# Patient Record
Sex: Female | Born: 1965 | Race: White | Hispanic: No | Marital: Married | Smoking: Former smoker
Health system: Southern US, Community
[De-identification: ages and names within clinical notes are randomized; demographics above are authoritative.]

## PROBLEM LIST (undated history)

## (undated) DIAGNOSIS — M8580 Other specified disorders of bone density and structure, unspecified site: Secondary | ICD-10-CM

## (undated) DIAGNOSIS — M7710 Lateral epicondylitis, unspecified elbow: Secondary | ICD-10-CM

## (undated) DIAGNOSIS — R51 Headache: Secondary | ICD-10-CM

## (undated) DIAGNOSIS — Z860100 Personal history of colon polyps, unspecified: Secondary | ICD-10-CM

## (undated) DIAGNOSIS — K449 Diaphragmatic hernia without obstruction or gangrene: Secondary | ICD-10-CM

## (undated) DIAGNOSIS — R519 Headache, unspecified: Secondary | ICD-10-CM

## (undated) DIAGNOSIS — M810 Age-related osteoporosis without current pathological fracture: Secondary | ICD-10-CM

## (undated) DIAGNOSIS — K317 Polyp of stomach and duodenum: Secondary | ICD-10-CM

## (undated) DIAGNOSIS — K529 Noninfective gastroenteritis and colitis, unspecified: Secondary | ICD-10-CM

## (undated) DIAGNOSIS — G43909 Migraine, unspecified, not intractable, without status migrainosus: Secondary | ICD-10-CM

## (undated) DIAGNOSIS — K219 Gastro-esophageal reflux disease without esophagitis: Secondary | ICD-10-CM

## (undated) DIAGNOSIS — G8929 Other chronic pain: Secondary | ICD-10-CM

## (undated) DIAGNOSIS — F419 Anxiety disorder, unspecified: Secondary | ICD-10-CM

## (undated) DIAGNOSIS — K589 Irritable bowel syndrome without diarrhea: Secondary | ICD-10-CM

## (undated) HISTORY — DX: Personal history of colon polyps, unspecified: Z86.0100

## (undated) HISTORY — PX: AUGMENTATION MAMMAPLASTY: SUR837

## (undated) HISTORY — DX: Personal history of colonic polyps: Z86.010

## (undated) HISTORY — DX: Headache: R51

## (undated) HISTORY — DX: Anxiety disorder, unspecified: F41.9

## (undated) HISTORY — DX: Noninfective gastroenteritis and colitis, unspecified: K52.9

## (undated) HISTORY — DX: Other chronic pain: G89.29

## (undated) HISTORY — DX: Lateral epicondylitis, unspecified elbow: M77.10

## (undated) HISTORY — DX: Diaphragmatic hernia without obstruction or gangrene: K44.9

## (undated) HISTORY — PX: COLONOSCOPY: SHX174

## (undated) HISTORY — DX: Gastro-esophageal reflux disease without esophagitis: K21.9

## (undated) HISTORY — DX: Irritable bowel syndrome, unspecified: K58.9

## (undated) HISTORY — DX: Headache, unspecified: R51.9

## (undated) HISTORY — DX: Other specified disorders of bone density and structure, unspecified site: M85.80

## (undated) HISTORY — PX: POLYPECTOMY: SHX149

## (undated) HISTORY — DX: Age-related osteoporosis without current pathological fracture: M81.0

## (undated) HISTORY — PX: BREAST ENHANCEMENT SURGERY: SHX7

## (undated) HISTORY — DX: Polyp of stomach and duodenum: K31.7

---

## 1998-08-27 ENCOUNTER — Other Ambulatory Visit: Admission: RE | Admit: 1998-08-27 | Discharge: 1998-08-27 | Payer: Self-pay | Admitting: Obstetrics & Gynecology

## 1998-08-30 ENCOUNTER — Other Ambulatory Visit: Admission: RE | Admit: 1998-08-30 | Discharge: 1998-08-30 | Payer: Self-pay | Admitting: Internal Medicine

## 1999-08-28 ENCOUNTER — Other Ambulatory Visit: Admission: RE | Admit: 1999-08-28 | Discharge: 1999-08-28 | Payer: Self-pay | Admitting: Obstetrics & Gynecology

## 2000-09-15 ENCOUNTER — Other Ambulatory Visit: Admission: RE | Admit: 2000-09-15 | Discharge: 2000-09-15 | Payer: Self-pay | Admitting: Obstetrics & Gynecology

## 2001-10-04 ENCOUNTER — Other Ambulatory Visit: Admission: RE | Admit: 2001-10-04 | Discharge: 2001-10-04 | Payer: Self-pay | Admitting: Obstetrics & Gynecology

## 2002-10-17 ENCOUNTER — Other Ambulatory Visit: Admission: RE | Admit: 2002-10-17 | Discharge: 2002-10-17 | Payer: Self-pay | Admitting: Obstetrics & Gynecology

## 2003-08-10 ENCOUNTER — Encounter: Admission: RE | Admit: 2003-08-10 | Discharge: 2003-11-08 | Payer: Self-pay | Admitting: Internal Medicine

## 2003-11-07 ENCOUNTER — Other Ambulatory Visit: Admission: RE | Admit: 2003-11-07 | Discharge: 2003-11-07 | Payer: Self-pay | Admitting: Obstetrics & Gynecology

## 2003-12-04 ENCOUNTER — Emergency Department (HOSPITAL_COMMUNITY): Admission: EM | Admit: 2003-12-04 | Discharge: 2003-12-04 | Payer: Self-pay

## 2004-12-02 ENCOUNTER — Other Ambulatory Visit: Admission: RE | Admit: 2004-12-02 | Discharge: 2004-12-02 | Payer: Self-pay | Admitting: Obstetrics & Gynecology

## 2005-08-28 ENCOUNTER — Encounter: Admission: RE | Admit: 2005-08-28 | Discharge: 2005-08-28 | Payer: Self-pay | Admitting: Internal Medicine

## 2005-09-05 ENCOUNTER — Encounter: Admission: RE | Admit: 2005-09-05 | Discharge: 2005-09-05 | Payer: Self-pay | Admitting: Plastic Surgery

## 2007-03-02 ENCOUNTER — Encounter: Admission: RE | Admit: 2007-03-02 | Discharge: 2007-03-02 | Payer: Self-pay | Admitting: Internal Medicine

## 2007-03-17 ENCOUNTER — Encounter: Admission: RE | Admit: 2007-03-17 | Discharge: 2007-03-17 | Payer: Self-pay | Admitting: Internal Medicine

## 2007-03-17 ENCOUNTER — Encounter (INDEPENDENT_AMBULATORY_CARE_PROVIDER_SITE_OTHER): Payer: Self-pay | Admitting: Diagnostic Radiology

## 2007-03-17 ENCOUNTER — Other Ambulatory Visit: Admission: RE | Admit: 2007-03-17 | Discharge: 2007-03-17 | Payer: Self-pay | Admitting: Diagnostic Radiology

## 2007-05-04 ENCOUNTER — Ambulatory Visit: Payer: Self-pay | Admitting: Internal Medicine

## 2007-05-12 ENCOUNTER — Ambulatory Visit: Payer: Self-pay | Admitting: Internal Medicine

## 2007-05-13 ENCOUNTER — Ambulatory Visit: Payer: Self-pay | Admitting: Internal Medicine

## 2007-05-13 ENCOUNTER — Encounter: Admission: RE | Admit: 2007-05-13 | Discharge: 2007-05-13 | Payer: Self-pay | Admitting: Obstetrics & Gynecology

## 2007-05-14 ENCOUNTER — Ambulatory Visit: Payer: Self-pay | Admitting: Internal Medicine

## 2007-05-17 ENCOUNTER — Ambulatory Visit: Payer: Self-pay | Admitting: Internal Medicine

## 2007-05-24 ENCOUNTER — Ambulatory Visit: Payer: Self-pay | Admitting: Internal Medicine

## 2007-05-31 ENCOUNTER — Ambulatory Visit: Payer: Self-pay | Admitting: Internal Medicine

## 2007-06-07 ENCOUNTER — Ambulatory Visit: Payer: Self-pay | Admitting: Internal Medicine

## 2007-06-17 ENCOUNTER — Ambulatory Visit: Payer: Self-pay | Admitting: Internal Medicine

## 2007-06-17 ENCOUNTER — Encounter: Payer: Self-pay | Admitting: Internal Medicine

## 2007-06-17 ENCOUNTER — Encounter: Payer: Self-pay | Admitting: Gastroenterology

## 2007-06-17 DIAGNOSIS — K449 Diaphragmatic hernia without obstruction or gangrene: Secondary | ICD-10-CM

## 2007-06-17 DIAGNOSIS — K219 Gastro-esophageal reflux disease without esophagitis: Secondary | ICD-10-CM

## 2007-06-17 HISTORY — DX: Diaphragmatic hernia without obstruction or gangrene: K44.9

## 2007-06-17 HISTORY — DX: Gastro-esophageal reflux disease without esophagitis: K21.9

## 2007-07-07 ENCOUNTER — Ambulatory Visit: Payer: Self-pay | Admitting: Internal Medicine

## 2007-08-09 ENCOUNTER — Ambulatory Visit: Payer: Self-pay | Admitting: Internal Medicine

## 2007-08-20 DIAGNOSIS — M899 Disorder of bone, unspecified: Secondary | ICD-10-CM | POA: Insufficient documentation

## 2007-08-20 DIAGNOSIS — F411 Generalized anxiety disorder: Secondary | ICD-10-CM | POA: Insufficient documentation

## 2007-08-20 DIAGNOSIS — K5289 Other specified noninfective gastroenteritis and colitis: Secondary | ICD-10-CM | POA: Insufficient documentation

## 2007-08-20 DIAGNOSIS — T7840XA Allergy, unspecified, initial encounter: Secondary | ICD-10-CM | POA: Insufficient documentation

## 2007-08-20 DIAGNOSIS — K589 Irritable bowel syndrome without diarrhea: Secondary | ICD-10-CM

## 2007-08-20 DIAGNOSIS — M949 Disorder of cartilage, unspecified: Secondary | ICD-10-CM

## 2007-08-20 DIAGNOSIS — K921 Melena: Secondary | ICD-10-CM | POA: Insufficient documentation

## 2007-09-09 ENCOUNTER — Ambulatory Visit: Payer: Self-pay | Admitting: Internal Medicine

## 2007-10-07 ENCOUNTER — Ambulatory Visit: Payer: Self-pay | Admitting: Internal Medicine

## 2007-11-05 ENCOUNTER — Ambulatory Visit: Payer: Self-pay | Admitting: Internal Medicine

## 2007-11-11 ENCOUNTER — Ambulatory Visit: Payer: Self-pay | Admitting: Internal Medicine

## 2007-11-11 LAB — CONVERTED CEMR LAB: Vitamin B-12: 446 pg/mL (ref 211–911)

## 2008-05-19 ENCOUNTER — Ambulatory Visit: Payer: Self-pay | Admitting: Internal Medicine

## 2008-05-22 LAB — CONVERTED CEMR LAB: Vitamin B-12: 186 pg/mL — ABNORMAL LOW (ref 211–911)

## 2008-05-25 ENCOUNTER — Encounter: Admission: RE | Admit: 2008-05-25 | Discharge: 2008-05-25 | Payer: Self-pay | Admitting: Obstetrics & Gynecology

## 2008-05-29 ENCOUNTER — Ambulatory Visit: Payer: Self-pay | Admitting: Internal Medicine

## 2008-05-29 DIAGNOSIS — D518 Other vitamin B12 deficiency anemias: Secondary | ICD-10-CM

## 2008-05-30 ENCOUNTER — Ambulatory Visit: Payer: Self-pay | Admitting: Internal Medicine

## 2008-05-31 ENCOUNTER — Ambulatory Visit: Payer: Self-pay | Admitting: Internal Medicine

## 2008-06-05 ENCOUNTER — Ambulatory Visit: Payer: Self-pay | Admitting: Internal Medicine

## 2008-06-12 ENCOUNTER — Ambulatory Visit: Payer: Self-pay | Admitting: Internal Medicine

## 2008-06-19 ENCOUNTER — Ambulatory Visit: Payer: Self-pay | Admitting: Internal Medicine

## 2008-07-19 ENCOUNTER — Ambulatory Visit: Payer: Self-pay | Admitting: Internal Medicine

## 2008-08-21 ENCOUNTER — Ambulatory Visit: Payer: Self-pay | Admitting: Internal Medicine

## 2008-09-21 ENCOUNTER — Ambulatory Visit: Payer: Self-pay | Admitting: Gastroenterology

## 2008-09-26 ENCOUNTER — Encounter: Admission: RE | Admit: 2008-09-26 | Discharge: 2008-09-26 | Payer: Self-pay | Admitting: Obstetrics & Gynecology

## 2008-10-19 ENCOUNTER — Ambulatory Visit: Payer: Self-pay | Admitting: Gastroenterology

## 2008-11-13 ENCOUNTER — Ambulatory Visit: Payer: Self-pay | Admitting: Internal Medicine

## 2008-11-14 LAB — CONVERTED CEMR LAB: Vitamin B-12: 397 pg/mL (ref 211–911)

## 2008-11-20 ENCOUNTER — Ambulatory Visit: Payer: Self-pay | Admitting: Internal Medicine

## 2008-12-21 ENCOUNTER — Ambulatory Visit: Payer: Self-pay | Admitting: Internal Medicine

## 2009-01-23 ENCOUNTER — Ambulatory Visit: Payer: Self-pay | Admitting: Internal Medicine

## 2009-02-22 ENCOUNTER — Ambulatory Visit: Payer: Self-pay | Admitting: Internal Medicine

## 2009-03-23 ENCOUNTER — Ambulatory Visit: Payer: Self-pay | Admitting: Internal Medicine

## 2009-04-23 ENCOUNTER — Ambulatory Visit: Payer: Self-pay | Admitting: Internal Medicine

## 2009-05-14 ENCOUNTER — Ambulatory Visit: Payer: Self-pay | Admitting: Internal Medicine

## 2009-05-15 LAB — CONVERTED CEMR LAB: Vitamin B-12: 285 pg/mL (ref 211–911)

## 2009-05-21 ENCOUNTER — Ambulatory Visit: Payer: Self-pay | Admitting: Internal Medicine

## 2009-05-28 ENCOUNTER — Encounter: Admission: RE | Admit: 2009-05-28 | Discharge: 2009-05-28 | Payer: Self-pay | Admitting: Obstetrics & Gynecology

## 2009-06-20 ENCOUNTER — Ambulatory Visit: Payer: Self-pay | Admitting: Internal Medicine

## 2010-05-07 ENCOUNTER — Ambulatory Visit: Payer: Self-pay | Admitting: Internal Medicine

## 2010-05-09 LAB — CONVERTED CEMR LAB: Vitamin B-12: 510 pg/mL (ref 211–911)

## 2010-05-29 ENCOUNTER — Encounter: Admission: RE | Admit: 2010-05-29 | Discharge: 2010-05-29 | Payer: Self-pay | Admitting: Obstetrics & Gynecology

## 2010-09-01 LAB — CONVERTED CEMR LAB
Basophils Absolute: 0 10*3/uL (ref 0.0–0.1)
Basophils Relative: 0.3 % (ref 0.0–1.0)
Eosinophils Absolute: 0 10*3/uL (ref 0.0–0.6)
Eosinophils Relative: 1.1 % (ref 0.0–5.0)
HCT: 39.4 % (ref 36.0–46.0)
Hemoglobin: 13.6 g/dL (ref 12.0–15.0)
Iron: 104 ug/dL (ref 42–145)
Lymphocytes Relative: 31.7 % (ref 12.0–46.0)
MCHC: 34.6 g/dL (ref 30.0–36.0)
MCV: 88.9 fL (ref 78.0–100.0)
Monocytes Absolute: 0.4 10*3/uL (ref 0.2–0.7)
Monocytes Relative: 11.4 % — ABNORMAL HIGH (ref 3.0–11.0)
Neutro Abs: 2.1 10*3/uL (ref 1.4–7.7)
Neutrophils Relative %: 55.5 % (ref 43.0–77.0)
Platelets: 329 10*3/uL (ref 150–400)
RBC: 4.43 M/uL (ref 3.87–5.11)
RDW: 11.5 % (ref 11.5–14.6)
Saturation Ratios: 23.3 % (ref 20.0–50.0)
Sed Rate: 14 mm/hr (ref 0–25)
Tissue Transglutaminase Ab, IgA: 0.5 units (ref <7)
Transferrin: 319.5 mg/dL (ref >=212.0)
Vitamin B-12: 120 pg/mL — ABNORMAL LOW (ref 211–911)
WBC: 3.7 10*3/uL — ABNORMAL LOW (ref 4.5–10.5)

## 2010-10-03 ENCOUNTER — Encounter (INDEPENDENT_AMBULATORY_CARE_PROVIDER_SITE_OTHER): Payer: Self-pay | Admitting: *Deleted

## 2010-10-03 ENCOUNTER — Other Ambulatory Visit: Payer: Self-pay | Admitting: Internal Medicine

## 2010-10-03 ENCOUNTER — Other Ambulatory Visit: Payer: Self-pay

## 2010-10-03 DIAGNOSIS — D509 Iron deficiency anemia, unspecified: Secondary | ICD-10-CM

## 2010-10-03 LAB — CBC WITH DIFFERENTIAL/PLATELET
Basophils Absolute: 0 10*3/uL (ref 0.0–0.1)
Eosinophils Absolute: 0.1 10*3/uL (ref 0.0–0.7)
HCT: 38.1 % (ref 36.0–46.0)
Lymphs Abs: 1.9 10*3/uL (ref 0.7–4.0)
MCV: 90.1 fl (ref 78.0–100.0)
Monocytes Absolute: 0.5 10*3/uL (ref 0.1–1.0)
Neutro Abs: 2.4 10*3/uL (ref 1.4–7.7)
Platelets: 320 10*3/uL (ref 150.0–400.0)
RDW: 12.2 % (ref 11.5–14.6)
WBC: 4.9 10*3/uL (ref 4.5–10.5)

## 2010-10-29 ENCOUNTER — Other Ambulatory Visit: Payer: Self-pay | Admitting: Internal Medicine

## 2010-12-17 NOTE — Assessment & Plan Note (Signed)
[facility]                         GASTROENTEROLOGY OFFICE NOTE   NAME:Sue Blair                         MRN:          [id number]  DATE:05/04/2007                            DOB:          May 16, 1966    GASTROENTEROLOGY CONSULTATION   REASON FOR CONSULTATION:  Sue Blair is a very nice 45 year old white  female referred by Dr. Timothy Lasso for evaluation of Hemoccult positive stool.  The patient denies any visible blood per rectum.  The Hemoccult's were  positive on a home test.  We have seen Sue Blair in the past in 1995  initially for chronic diarrhea and subsequently in 2004 for evaluation  of possible Sprue.  Her mother has collagen vascular disease and  Sjogren's syndrome.  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  The diarrhea subsided spontaneously.  She was initially put on Questran with good results.  She denies  currently having any diarrhea.  As far as the Sprue evaluation is  concerned, she had a positive antigliaden antibody, IgG in 2000.  Her  reticulin IgA was negative and endomysial antibody IgA was negative.  Her serum keratin was low at 5.  We have done a small bowel biopsy on  her based on the blood abnormalities but she had no evidence of villous  atrophy.  Her duodenal biopsies were normal in January of 2000.  She has  a family history of colon cancer in both maternal and paternal uncles  and a history of Crohn's disease in paternal uncle.  She also has  osteopenia.   MEDICATIONS:  1. Garnette Scheuermann.  2. Birth control pills.  3. Paxil 20 mg p.o. daily.  4. Prilosec 20 mg p.o. daily.  5. Calcium supplements with vitamin D.   PAST MEDICAL HISTORY:  Significant for allergies, thyroid problems,  anxiety.   FAMILY HISTORY:  As mentioned above in history of present illness.   SOCIAL HISTORY:  Married with two children.  She works as an Engineer, manufacturing.  She doesn't smoke and doesn't drink  alcohol.   REVIEW OF SYSTEMS:  Is essentially negative.   PHYSICAL EXAMINATION:  VITAL SIGNS:  Blood pressure 106/64, pulse 78,  weight 155 Blair.  She was 121 Blair about 6 years ago.  GENERAL APPEARANCE:  She appears healthy and in no distress.  HEENT:  Sclerae is nonicteric.  Oral cavity did not show any aphthous  ulcers.  There was no cheilosis.  Tongue was papillated.  LUNGS:  Clear to auscultation.  NECK:  Thyroid was normal.  CARDIOVASCULAR:  Normal S1 and normal S2.  ABDOMEN:  Soft, somewhat doughy with normal active bowel sounds.  Normal  distention.  No tenderness.  Liver edge at coastal margin.  There was a  small ventral hernia from rectus diathesis in upper abdomen.  RECTAL:  Rectal exam today shows normal perirenal area with very scant  yellow Hemoccult negative stool.  EXTREMITIES:  No edema.  SKIN:  Did not show any palmar erythema or stigmata of chronic liver  disease.   IMPRESSION:  1. This is  a 45 year old white female with history of nonspecific      lymphocytic colitis, currently asymptomatic.  2. Positive serological markers for Sprue with normal small bowel      biopsy in 2000.  This raises a question of autoimmune disease      versus variant of celiac Sprue.  The patient has been on very      liberal gluten-free diet, eating bread on occasions.  3. Osteopenia.  4. Hemoccult positive stool, not reproduced on today's exam.  5. Positive family history of colon cancer in two indirect relatives      from both mother's and father's side.   PLAN:  1. The patient has a very interesting problem of having an autoimmune      markers without actually specific diagnosis.  I think it would be      prudent to determine whether she truly has a celiac Sprue because      this would have a great impact on her diet.  For this reason, I      think she needs to have a repeat upper endoscopy with small bowel      biopsies as well as colonoscopy to rule out ongoing low  grade      inflammatory bowel disease.  We will do the small bowel biopsy      after she has been off gluten-free diet completely for four to six      weeks, which would maximize our yield on the small biopsy which      ought to demonstrate at least some degree of villous aytrophy.  2. Today we will obtain inflammatory bowel disease markers, Prometheus      profile.  Also check on her tissue transglutaminase levels, sed      rate, IgA levels, B12, iron studies and CBC.  In the past, she had      normal iron levels.  3. The patient was, today, instructed to go off the gluten-free diet      and we have gone ahead and scheduled her upper endoscopy and      colonoscopy as well.     Hedwig Morton. Juanda Chance, MD  Electronically Signed    DMB/MedQ  DD: 05/04/2007  DT: 05/04/2007  Job #: [id number]   cc:   Sue Pounds, MD

## 2011-01-29 ENCOUNTER — Other Ambulatory Visit: Payer: Self-pay | Admitting: Internal Medicine

## 2011-04-27 ENCOUNTER — Other Ambulatory Visit: Payer: Self-pay | Admitting: Internal Medicine

## 2011-06-12 ENCOUNTER — Other Ambulatory Visit: Payer: Self-pay | Admitting: Obstetrics & Gynecology

## 2011-06-12 ENCOUNTER — Other Ambulatory Visit: Payer: Self-pay | Admitting: Internal Medicine

## 2011-06-12 DIAGNOSIS — Z1231 Encounter for screening mammogram for malignant neoplasm of breast: Secondary | ICD-10-CM

## 2011-07-10 ENCOUNTER — Ambulatory Visit: Payer: BC Managed Care – PPO

## 2011-08-04 ENCOUNTER — Ambulatory Visit
Admission: RE | Admit: 2011-08-04 | Discharge: 2011-08-04 | Disposition: A | Discharge status: Home / Self Care | Payer: BC Managed Care – PPO | Source: Ambulatory Visit | Attending: Obstetrics & Gynecology | Admitting: Obstetrics & Gynecology

## 2011-08-04 DIAGNOSIS — Z1231 Encounter for screening mammogram for malignant neoplasm of breast: Secondary | ICD-10-CM

## 2011-08-08 ENCOUNTER — Other Ambulatory Visit: Payer: Self-pay | Admitting: Obstetrics & Gynecology

## 2011-08-08 DIAGNOSIS — N63 Unspecified lump in unspecified breast: Secondary | ICD-10-CM

## 2011-08-15 ENCOUNTER — Ambulatory Visit
Admission: RE | Admit: 2011-08-15 | Discharge: 2011-08-15 | Disposition: A | Discharge status: Home / Self Care | Payer: BC Managed Care – PPO | Source: Ambulatory Visit | Attending: Obstetrics & Gynecology | Admitting: Obstetrics & Gynecology

## 2011-08-15 ENCOUNTER — Other Ambulatory Visit: Payer: Self-pay | Admitting: Obstetrics & Gynecology

## 2011-08-15 DIAGNOSIS — N63 Unspecified lump in unspecified breast: Secondary | ICD-10-CM

## 2011-12-31 ENCOUNTER — Telehealth: Payer: Self-pay | Admitting: Internal Medicine

## 2011-12-31 NOTE — Telephone Encounter (Signed)
Spoke with patient and she states one week ago, she began having pressure in the rectal area. States she feels like she needs to go to the bathroom but does not. Denies diarrhea or bleeding. States she does have some constipation at times. Today she also has lower back pain. She is concerned because she has an uncle that had colon cancer. Normal colonoscopy in 06/2007. Scheduled OV with Willette Cluster, NP on 01/01/12 at 3:30 PM.

## 2012-01-01 ENCOUNTER — Encounter: Payer: Self-pay | Admitting: *Deleted

## 2012-01-01 ENCOUNTER — Other Ambulatory Visit: Payer: Self-pay | Admitting: Internal Medicine

## 2012-01-01 ENCOUNTER — Ambulatory Visit (INDEPENDENT_AMBULATORY_CARE_PROVIDER_SITE_OTHER): Payer: BC Managed Care – PPO | Admitting: Nurse Practitioner

## 2012-01-01 VITALS — BP 110/84 | HR 88 | Ht 66.0 in | Wt 152.0 lb

## 2012-01-01 DIAGNOSIS — K648 Other hemorrhoids: Secondary | ICD-10-CM

## 2012-01-01 DIAGNOSIS — K5900 Constipation, unspecified: Secondary | ICD-10-CM

## 2012-01-01 MED ORDER — HYDROCORTISONE ACETATE 25 MG RE SUPP: 25.0000 mg | Freq: Two times a day (BID) | RECTAL | Status: AC

## 2012-01-01 NOTE — Progress Notes (Signed)
01/01/2012 Sue Blair [id number] 1966/03/29   HISTORY OF PRESENT ILLNESS: Patient is a 46 year old female known to Dr. Juanda Chance. She had an EGD / colon in November 2008. Patient comes in today for evaluation of rectal pain. No rectal bleedings. Pain up in left side of rectum. Defecation helps for a few minutes then pressure returns. Patient was constipated last week after taking a few Bentyl. She is doing better after starting stool softeners    Past Medical History  Diagnosis Date  . Hiatal hernia 06/17/2007  . Reflux esophagitis 06/17/2007  . Benign neoplasm of stomach   . Anxiety   . Chronic headaches   . History of colon polyps   . IBS (irritable bowel syndrome)    Past Surgical History  Procedure Date  . Cesarean section 1998  . Breast enhancement surgery     reports that she has quit smoking. She has never used smokeless tobacco. She reports that she does not drink alcohol or use illicit drugs. family history includes Breast cancer in her maternal aunt, maternal grandmother, and mother; Colon cancer in her maternal uncle and paternal uncle; Crohn's disease in her paternal uncle; and Diabetes in her father. Allergies  Allergen Reactions  . Clarithromycin   . Sulfonamide Derivatives       Outpatient Encounter Prescriptions as of 01/01/2012  Medication Sig Dispense Refill  . ALPRAZolam (XANAX) 0.5 MG tablet Take 0.5 mg by mouth at bedtime as needed.      Marland Kitchen aspirin 81 MG tablet Take 81 mg by mouth daily.      . Calcium Citrate-Vitamin D (CITRUS CALCIUM + D PO) Take 1 tablet by mouth daily.      Marland Kitchen desogestrel-ethinyl estradiol (VIORELE) 0.15-0.02/0.01 MG (21/5) tablet Take 1 tablet by mouth daily.      Marland Kitchen dicyclomine (BENTYL) 20 MG tablet Take 20 mg by mouth 3 (three) times daily as needed.      . ondansetron (ZOFRAN) 4 MG tablet Take 4 mg by mouth as needed.      Marland Kitchen oxyCODONE-acetaminophen (PERCOCET) 5-325 MG per tablet Take 1 tablet by mouth as needed.      Marland Kitchen PRILOSEC OTC 20  MG tablet TAKE 1 TABLET BY MOUTH TWO TIMES A DAY  84 tablet  0  . venlafaxine XR (EFFEXOR-XR) 150 MG 24 hr capsule Take 150 mg by mouth daily.         REVIEW OF SYSTEMS  : Positive for sinus trouble, anxiety, headaches, and night sweats. All other systems reviewed and negative except where noted in the History of Present Illness.   PHYSICAL EXAM: BP 110/84  Pulse 88  Ht 5\' 6"  (1.676 m)  Wt 152 lb (68.947 kg)  BMI 24.53 kg/m2  LMP 04/14/2011 General: Well developed white female in no acute distress Head: Normocephalic and atraumatic Eyes:  sclerae anicteric,conjunctive pink. Ears: Normal auditory acuity Neck: Supple, no masses.  Lungs: Clear throughout to auscultation Heart: Regular rate and rhythm; no murmurs heard Abdomen: Soft, non distended, nontender. No masses or hepatomegaly noted. Normal Bowel sounds Rectal: no external lesions. On anoscopy there are swollen internal hemorrhoids. Musculoskeletal: Symmetrical with no gross deformities  Skin: No lesions on visible extremities Extremities: No edema or deformities noted Neurological: Alert oriented, grossly nonfocal Psychological:  Alert and cooperative. Normal mood and affect  ASSESSMENT AND PLAN; 76. 46 year old female with rectal pressure. She has inflamed internal hemorrhoids of exam. Suspect discomfort (pressure) relates to her internal hemorrhoids in some way. Will  treat with a course of steroid suppositories. She will good need bowel regimen to prevent future hemorrhoidal flares. See $2.  2. Constipation. Start Fiber. Begain daily Miralax.

## 2012-01-01 NOTE — Telephone Encounter (Signed)
She has a hx of lymphocytic colitis 1995, I agree with an appointment

## 2012-01-01 NOTE — Patient Instructions (Signed)
We sent prescription for Anusol [facility] Suppository twice daily for 10 days to [facility] [address]. We have given you coupons for Metamucil.  Continue Stool softners and if stools are not soft daily you can take Miralax as needed.  Call us in 10 days for an update.

## 2012-01-04 ENCOUNTER — Encounter: Payer: Self-pay | Admitting: Nurse Practitioner

## 2012-01-04 DIAGNOSIS — K5900 Constipation, unspecified: Secondary | ICD-10-CM | POA: Insufficient documentation

## 2012-01-04 DIAGNOSIS — K648 Other hemorrhoids: Secondary | ICD-10-CM | POA: Insufficient documentation

## 2012-01-06 NOTE — Progress Notes (Signed)
Agree with Ms. Guenther's assessment and plan. Suhayla Chisom E. Kafi Dotter, MD, FACG   

## 2012-06-21 ENCOUNTER — Other Ambulatory Visit: Payer: Self-pay | Admitting: Internal Medicine

## 2012-06-21 DIAGNOSIS — E538 Deficiency of other specified B group vitamins: Secondary | ICD-10-CM

## 2012-06-21 NOTE — Telephone Encounter (Signed)
Patient saw Dr. Jennette Kettle a couple of months ago and her B 12 was low. She is going to fax the results for Dr. Juanda Chance to review. She states she took B12 injections in the past and thinks she may need to restart them. She is interested in trying the nasal spray if possible. Will have MD review labs. Please, advise.

## 2012-06-22 NOTE — Telephone Encounter (Signed)
Please obtain B12 level or fax from Dr Jennette Kettle. Nasal spray may be more expensive. Marland Kitchen

## 2012-06-22 NOTE — Telephone Encounter (Signed)
Lab in MD basket for review.

## 2012-06-23 MED ORDER — DICYCLOMINE HCL 20 MG PO TABS: ORAL_TABLET | ORAL | Status: DC

## 2012-06-23 MED ORDER — CYANOCOBALAMIN 1000 MCG/ML IJ SOLN: INTRAMUSCULAR | Status: DC

## 2012-06-23 NOTE — Telephone Encounter (Signed)
B12=171pg, too low. I don't recommend nasal B12 since it is unreliable. She should start B12 1000ug IM q week x 4 then she can switch to nasal B12, recheck B12 level in 6 months

## 2012-06-23 NOTE — Telephone Encounter (Signed)
Spoke with patient and she had decided to do injection of B12. Rx sent to her pharmacy for this. She will have monthly injections after the 4 weekly injections as per Dr. Juanda Chance. Labs in [facility] for 6 month follow up.  Patient also asking for refill of Bentyl.Patient aware.

## 2012-06-23 NOTE — Telephone Encounter (Signed)
Dr. Donnetta Hail lab results are in MD basket for review.

## 2012-09-29 ENCOUNTER — Other Ambulatory Visit: Payer: Self-pay

## 2012-09-29 DIAGNOSIS — Z1231 Encounter for screening mammogram for malignant neoplasm of breast: Secondary | ICD-10-CM

## 2012-09-29 DIAGNOSIS — Z9882 Breast implant status: Secondary | ICD-10-CM

## 2012-09-29 DIAGNOSIS — Z803 Family history of malignant neoplasm of breast: Secondary | ICD-10-CM

## 2012-10-13 ENCOUNTER — Other Ambulatory Visit: Payer: Self-pay | Admitting: Internal Medicine

## 2012-10-14 NOTE — Telephone Encounter (Signed)
Patient advised that there is a Acupuncturist on b12 injectable solution. I have given the patient an option of Nascobal or doing oral b12 daily x 1 month. She would rather take an oral b12 1000 mcg daily x 1 month. She will then come back for her scheduled b12 level to be drawn. Advised that once we get these levels back, we will plan further disposition. Patient verbalizes understanding.

## 2012-10-18 ENCOUNTER — Ambulatory Visit
Admission: RE | Admit: 2012-10-18 | Discharge: 2012-10-18 | Disposition: A | Discharge status: Home / Self Care | Payer: BC Managed Care – PPO | Source: Ambulatory Visit

## 2012-10-18 DIAGNOSIS — Z803 Family history of malignant neoplasm of breast: Secondary | ICD-10-CM

## 2012-10-18 DIAGNOSIS — Z9882 Breast implant status: Secondary | ICD-10-CM

## 2012-10-18 DIAGNOSIS — Z1231 Encounter for screening mammogram for malignant neoplasm of breast: Secondary | ICD-10-CM

## 2012-12-16 ENCOUNTER — Telehealth: Payer: Self-pay | Admitting: *Deleted

## 2012-12-16 DIAGNOSIS — E538 Deficiency of other specified B group vitamins: Secondary | ICD-10-CM

## 2012-12-16 NOTE — Telephone Encounter (Signed)
Spoke with patient and she will come for lab work. 

## 2012-12-16 NOTE — Telephone Encounter (Signed)
Message copied by Daphine Deutscher on Thu Dec 16, 2012  2:17 PM ------      Message from: Daphine Deutscher      Created: Wed Jun 23, 2012  2:33 PM       Call and remind patient B 12 level due for db on 12/20/12 ------

## 2012-12-22 ENCOUNTER — Other Ambulatory Visit (INDEPENDENT_AMBULATORY_CARE_PROVIDER_SITE_OTHER): Payer: BC Managed Care – PPO

## 2012-12-22 DIAGNOSIS — E538 Deficiency of other specified B group vitamins: Secondary | ICD-10-CM

## 2012-12-23 ENCOUNTER — Other Ambulatory Visit: Payer: Self-pay | Admitting: *Deleted

## 2012-12-23 DIAGNOSIS — E538 Deficiency of other specified B group vitamins: Secondary | ICD-10-CM

## 2013-06-23 ENCOUNTER — Other Ambulatory Visit (INDEPENDENT_AMBULATORY_CARE_PROVIDER_SITE_OTHER): Payer: BC Managed Care – PPO

## 2013-06-23 ENCOUNTER — Telehealth: Payer: Self-pay | Admitting: *Deleted

## 2013-06-23 DIAGNOSIS — E538 Deficiency of other specified B group vitamins: Secondary | ICD-10-CM

## 2013-06-23 NOTE — Telephone Encounter (Signed)
Message copied by Daphine Deutscher on Thu Jun 23, 2013  8:21 AM ------      Message from: Daphine Deutscher      Created: Thu Dec 23, 2012  1:31 PM       Call and remind due for B12 level DB on 06/27/13 ------

## 2013-06-23 NOTE — Telephone Encounter (Signed)
Spoke with patient and she will come for lab. 

## 2013-06-24 ENCOUNTER — Other Ambulatory Visit: Payer: Self-pay | Admitting: *Deleted

## 2013-06-24 DIAGNOSIS — E538 Deficiency of other specified B group vitamins: Secondary | ICD-10-CM

## 2013-08-30 ENCOUNTER — Telehealth: Payer: Self-pay | Admitting: Internal Medicine

## 2013-08-30 NOTE — Telephone Encounter (Signed)
Left a message for patient to call me. 

## 2013-08-30 NOTE — Telephone Encounter (Signed)
Spoke with patient and she states she has had lower abdominal pain x 1 week. She saw her GYN MD and he could not find anything wrong "with my female parts and he suggested I see GI." States she also has a back ache. Offered OV with extender but she wants to see Dr. Juanda ChanceBrodie. Scheduled on 09/09/13 at 2:00 PM.

## 2013-09-02 ENCOUNTER — Encounter: Payer: Self-pay | Admitting: *Deleted

## 2013-09-09 ENCOUNTER — Other Ambulatory Visit (INDEPENDENT_AMBULATORY_CARE_PROVIDER_SITE_OTHER): Payer: BC Managed Care – PPO

## 2013-09-09 ENCOUNTER — Encounter: Payer: Self-pay | Admitting: Internal Medicine

## 2013-09-09 ENCOUNTER — Ambulatory Visit (INDEPENDENT_AMBULATORY_CARE_PROVIDER_SITE_OTHER): Payer: BC Managed Care – PPO | Admitting: Internal Medicine

## 2013-09-09 VITALS — BP 100/72 | HR 96 | Ht 61.81 in | Wt 147.4 lb

## 2013-09-09 DIAGNOSIS — E538 Deficiency of other specified B group vitamins: Secondary | ICD-10-CM

## 2013-09-09 DIAGNOSIS — K5289 Other specified noninfective gastroenteritis and colitis: Secondary | ICD-10-CM

## 2013-09-09 DIAGNOSIS — R1032 Left lower quadrant pain: Secondary | ICD-10-CM

## 2013-09-09 LAB — VITAMIN B12: Vitamin B-12: 231 pg/mL (ref 211–911)

## 2013-09-09 LAB — IBC PANEL
IRON: 98 ug/dL (ref 42–145)
Saturation Ratios: 21.7 % (ref 20.0–50.0)
TRANSFERRIN: 322.6 mg/dL (ref 212.0–360.0)

## 2013-09-09 LAB — IGA: IgA: 395 mg/dL — ABNORMAL HIGH (ref 68–378)

## 2013-09-09 LAB — SEDIMENTATION RATE: SED RATE: 17 mm/h (ref 0–22)

## 2013-09-09 MED ORDER — CYANOCOBALAMIN 1000 MCG/ML IJ SOLN: 1000.0000 ug | INTRAMUSCULAR | Status: DC

## 2013-09-09 MED ORDER — OMEPRAZOLE-SODIUM BICARBONATE 20-1100 MG PO CAPS: 1.0000 | ORAL_CAPSULE | Freq: Every day | ORAL | Status: DC

## 2013-09-09 MED ORDER — HYOSCYAMINE SULFATE 0.125 MG SL SUBL: 0.1250 mg | SUBLINGUAL_TABLET | Freq: Two times a day (BID) | SUBLINGUAL | Status: DC

## 2013-09-09 NOTE — Patient Instructions (Addendum)
We have sent the following medications to your pharmacy for you to pick up at your convenience: Levsin SL Zegerid B12  Please purchase the following medications over the counter and take as directed: Probiotic (generic is fine)  Your physician has requested that you go to the basement for the following lab work before leaving today: Celiac 10, IgA, B12, Sed Rate, IBC  CC: Dr Creola CornJohn Russo, Dr Konrad Doloreson Neal

## 2013-09-09 NOTE — Progress Notes (Signed)
Sue Blair February 20, 1966 [id number]  Note: This dictation was prepared with Dragon digital system. Any transcriptional errors that result from this procedure are unintentional.   History of Present Illness:  This is a 48 year old white female with a 3 week history of lower abdominal pain across the lower abdomen, mostly on the left lower quadrant. It is crampy as well as constant. Her bowel habits have been normal. She has occasional diarrhea. There has been no bleeding. Her eating habits have not changed. Her weight has been stable. She has a history of lymphocytic colitis documented on flexible sigmoidoscopy in 1995. A subsequent colonoscopy in November 2008 with random biopsies showed normal. Dr Jennette Kettle suggested GI eval. mucosa. There was a questionable history of sprue but the upper endoscopy and small bowel biopsies  in 2004 and again in November 2008 were negative.She says that she has been on a limited gluten free diet. She has a history of B12 deficiency in 1995 with B12 levels of 120,  she responded to B12 injections. In October 2008, but B12  dropped down to 186. She saw her gynecologist, Dr. Jennette Kettle last week and underwent a pelvic ultrasound.    Past Medical History  Diagnosis Date  . Hiatal hernia 06/17/2007  . GERD (gastroesophageal reflux disease) 06/17/2007  . Osteopenia   . Anxiety   . Chronic headaches   . History of colon polyps   . IBS (irritable bowel syndrome)   . Colitis   . Gastric polyp     Past Surgical History  Procedure Laterality Date  . Cesarean section  1998  . Breast enhancement surgery      Allergies  Allergen Reactions  . Clarithromycin   . Sulfonamide Derivatives     Family history and social history have been reviewed.  Review of Systems: Denies nausea heartburn would like to switch to a stronger PPI  The remainder of the 10 point ROS is negative except as outlined in the H&P  Physical Exam: General Appearance Well developed, in no  distress Eyes  Non icteric  HEENT  Non traumatic, normocephalic  Mouth No lesion, tongue papillated, no cheilosis Neck Supple without adenopathy, thyroid not enlarged, no carotid bruits, no JVD Lungs Clear to auscultation bilaterally COR Normal S1, normal S2, regular rhythm, no murmur, quiet precordium Abdomen soft abdomen mildly tender in left lower quadrant. No tympany. Normoactive bowel sounds. Liver edge at costal margin Rectal soft Hemoccult negative stool Extremities  No pedal edema Skin No lesions Neurological Alert and oriented x 3 Psychological Normal mood and affect  Assessment and Plan:   Problem #1 Questionable history of autoimmune disease presenting with lymphocytic colitis in 1995,  and abnormal sprue profile.( I cannot find it in her old paper chart) The most recent testing has been negative. She is now having a recurrence of abdominal bloating and lower abdominal pain but her exam is unremarkable and her stool is Hemoccult negative. We will recheck her sprue profile as well as IgA level, sedimentation rate, B12 and iron studies. She will start on regular B12 supplements on a monthly basis. We will also call in Zegrid 20 mg daily. I have given her samples of probiotics to take on a daily basis to improve her bacterial flora. Instead of Bentyl,which makes her constipated,  she will start Levsin sublingually 0.125 mg twice a day. She will call back in 4-6 weeks with a status update. If she is not improved, we will consider doing a colonoscopy with random biopsies.  Lina SarDora Alvia Tory 09/09/2013

## 2013-09-12 ENCOUNTER — Other Ambulatory Visit: Payer: Self-pay | Admitting: *Deleted

## 2013-09-12 ENCOUNTER — Encounter: Payer: Self-pay | Admitting: Internal Medicine

## 2013-09-12 DIAGNOSIS — R109 Unspecified abdominal pain: Secondary | ICD-10-CM

## 2013-09-12 LAB — CELIAC PANEL 10
ENDOMYSIAL SCREEN: NEGATIVE
Gliadin IgA: 5.7 U/mL (ref <20)
Gliadin IgG: 5.9 U/mL (ref <20)
IgA: 352 mg/dL (ref 69–380)
TISSUE TRANSGLUT AB: 11 U/mL (ref <20)
TISSUE TRANSGLUTAMINASE AB, IGA: 4.4 U/mL (ref <20)

## 2013-09-12 NOTE — Telephone Encounter (Signed)
Message copied by Richardson ChiquitoSMITH, Kairos Panetta N on Mon Sep 12, 2013  2:44 PM ------      Message from: Hart CarwinBRODIE, DORA M      Created: Mon Sep 12, 2013  1:55 PM       I mentioned Flagyl 250 mg po tid x 7 days but then did not order it  since she was already given Probiotic. Would hold off on Flagyl  for now      ----- Message -----         From: Richardson Chiquitoorothy N Calder Oblinger, CMA         Sent: 09/12/2013  11:45 AM           To: Hart Carwinora M [name], MD            Dr Juanda ChanceBrodie, patient thought you mentioned something about an antibiotic to her when she was here for her office visit... I sent Zegerid, b12 and Levsin SL. She got Florastor OTC. Do you remember telling her that she needed an antibiotic?       ------

## 2013-09-12 NOTE — Telephone Encounter (Signed)
I have spoken to patient and have given her Dr Regino SchultzeBrodie's recommendations regarding holding off on Flagyl for now as well as her interpretation and instructions following labwork. Patient verbalizes understanding and states that she will come this week for additional labwork.

## 2013-09-14 ENCOUNTER — Other Ambulatory Visit: Payer: BC Managed Care – PPO

## 2013-09-14 DIAGNOSIS — R109 Unspecified abdominal pain: Secondary | ICD-10-CM

## 2013-09-15 LAB — INTRINSIC FACTOR ANTIBODIES: INTRINSIC FACTOR: NEGATIVE

## 2013-09-15 LAB — ANTI-PARIETAL ANTIBODY: PARIETAL CELL ANTIBODY-IGG: NEGATIVE

## 2013-10-02 HISTORY — PX: LAPAROSCOPIC ASSISTED VAGINAL HYSTERECTOMY: SHX5398

## 2013-10-12 ENCOUNTER — Encounter: Payer: Self-pay | Admitting: Internal Medicine

## 2013-10-18 ENCOUNTER — Ambulatory Visit: Payer: BC Managed Care – PPO | Admitting: Internal Medicine

## 2013-10-18 ENCOUNTER — Other Ambulatory Visit: Payer: Self-pay | Admitting: Internal Medicine

## 2013-10-19 ENCOUNTER — Other Ambulatory Visit: Payer: Self-pay | Admitting: Obstetrics & Gynecology

## 2014-01-31 ENCOUNTER — Other Ambulatory Visit: Payer: Self-pay

## 2014-01-31 DIAGNOSIS — Z1231 Encounter for screening mammogram for malignant neoplasm of breast: Secondary | ICD-10-CM

## 2014-02-09 ENCOUNTER — Ambulatory Visit
Admission: RE | Admit: 2014-02-09 | Discharge: 2014-02-09 | Disposition: A | Discharge status: Home / Self Care | Payer: BC Managed Care – PPO | Source: Ambulatory Visit

## 2014-02-09 DIAGNOSIS — Z1231 Encounter for screening mammogram for malignant neoplasm of breast: Secondary | ICD-10-CM

## 2014-02-11 ENCOUNTER — Other Ambulatory Visit: Payer: Self-pay | Admitting: Internal Medicine

## 2014-02-11 ENCOUNTER — Encounter: Payer: Self-pay | Admitting: Internal Medicine

## 2014-02-13 MED ORDER — CYANOCOBALAMIN 1000 MCG/ML IJ SOLN: 1000.0000 ug | INTRAMUSCULAR | Status: DC

## 2014-02-13 MED ORDER — DICYCLOMINE HCL 20 MG PO TABS: 20.0000 mg | ORAL_TABLET | Freq: Three times a day (TID) | ORAL | Status: DC

## 2014-03-27 ENCOUNTER — Other Ambulatory Visit: Payer: Self-pay | Admitting: Obstetrics & Gynecology

## 2014-03-28 ENCOUNTER — Telehealth: Payer: Self-pay | Admitting: Internal Medicine

## 2014-03-28 LAB — CYTOLOGY - PAP

## 2014-03-28 NOTE — Telephone Encounter (Signed)
Last colon 06/2007, hx of lymphocytic colitis. OK to schedule colon 06/2014.

## 2014-03-28 NOTE — Telephone Encounter (Signed)
She states she is not having any problems. She is doing better since she was last seen. She has met her deductible for the year. She turns 48 on 04/04/14. Please advise. Thanks

## 2014-03-29 ENCOUNTER — Other Ambulatory Visit: Payer: Self-pay

## 2014-03-29 NOTE — Telephone Encounter (Signed)
Patient advised of plan. Recall amended to 06/04/14. Notification 05/04/14.

## 2014-06-22 ENCOUNTER — Telehealth: Payer: Self-pay | Admitting: *Deleted

## 2014-06-22 DIAGNOSIS — Z1211 Encounter for screening for malignant neoplasm of colon: Secondary | ICD-10-CM

## 2014-06-22 NOTE — Telephone Encounter (Signed)
Patient will come for labs on 06/27/14. She states she had requested to have her colonoscopy back in August and was to have a recall. Scheduled pre vist on 06/27/14 at 2:00 PM and colonoscopy on 07/05/14 at 4:00 PM.

## 2014-06-22 NOTE — Telephone Encounter (Signed)
-----   Message from Daphine Deutscheregina N Savien Mamula, RN sent at 06/24/2013  1:07 PM EST ----- Call and remind patient [facility]

## 2014-06-27 ENCOUNTER — Other Ambulatory Visit (INDEPENDENT_AMBULATORY_CARE_PROVIDER_SITE_OTHER): Payer: BC Managed Care – PPO

## 2014-06-27 ENCOUNTER — Ambulatory Visit (AMBULATORY_SURGERY_CENTER): Payer: Self-pay | Admitting: *Deleted

## 2014-06-27 VITALS — Ht 66.0 in | Wt 133.6 lb

## 2014-06-27 DIAGNOSIS — Z8719 Personal history of other diseases of the digestive system: Secondary | ICD-10-CM

## 2014-06-27 DIAGNOSIS — E538 Deficiency of other specified B group vitamins: Secondary | ICD-10-CM

## 2014-06-27 LAB — VITAMIN B12: Vitamin B-12: 559 pg/mL (ref 211–911)

## 2014-06-27 MED ORDER — MOVIPREP 100 G PO SOLR: 1.0000 | Freq: Once | ORAL | Status: DC

## 2014-06-27 NOTE — Progress Notes (Signed)
Pt states not allergic to eggs or soy. ewm No diet pills, no blood thinners. ewm No home 02 use. ewm No issues with past sedation.ewm emmi video to pt's e mail. ewm [facility] and occasionally she is having trouble swallowing. I offered to TE Dr Juanda ChanceBrodie to ask about doing a double but pt didn't want to change her date which would have been out into January. She said if the issues increase or becomes a problem she will call but now its very infrequent. ewm

## 2014-06-28 ENCOUNTER — Other Ambulatory Visit: Payer: Self-pay | Admitting: *Deleted

## 2014-06-28 DIAGNOSIS — E538 Deficiency of other specified B group vitamins: Secondary | ICD-10-CM

## 2014-07-05 ENCOUNTER — Ambulatory Visit (AMBULATORY_SURGERY_CENTER): Payer: BC Managed Care – PPO | Admitting: Internal Medicine

## 2014-07-05 ENCOUNTER — Encounter: Payer: Self-pay | Admitting: Internal Medicine

## 2014-07-05 VITALS — BP 102/64 | HR 66 | Temp 98.7°F | Resp 16 | Ht 66.0 in | Wt 133.0 lb

## 2014-07-05 DIAGNOSIS — K921 Melena: Secondary | ICD-10-CM

## 2014-07-05 DIAGNOSIS — K5901 Slow transit constipation: Secondary | ICD-10-CM

## 2014-07-05 DIAGNOSIS — K6389 Other specified diseases of intestine: Secondary | ICD-10-CM

## 2014-07-05 DIAGNOSIS — Z8719 Personal history of other diseases of the digestive system: Secondary | ICD-10-CM

## 2014-07-05 MED ORDER — SODIUM CHLORIDE 0.9 % IV SOLN: 500.0000 mL | INTRAVENOUS | Status: DC

## 2014-07-05 NOTE — Progress Notes (Signed)
Stable to RR 

## 2014-07-05 NOTE — Op Note (Signed)
[facility] [address] [place], [id number]   COLONOSCOPY PROCEDURE REPORT  PATIENT: Sue Blair, Wendy G  MR#: [id number] BIRTHDATE: December 01, 1965 , 48  yrs. old GENDER: female ENDOSCOPIST: Hart Carwinora M Rabecca Birge, MD REFERRED AO:ZHYQBY:John Timothy Lassousso, M.D. PROCEDURE DATE:  07/05/2014 PROCEDURE:   Colonoscopy with biopsy First Screening Colonoscopy - Avg.  risk and is 50 yrs.  old or older - No.  Prior Negative Screening - Now for repeat screening. 10 or more years since last screening Prior Negative Screening - Now for repeat screening.  Less than 10 yrs Prior Negative Screening - Now for repeat screening.  Other: See Comments  History of Adenoma - Now for follow-up colonoscopy & has been > or = to 3 yrs.  N/A  Polyps Removed Today? No.  Polyps Removed Today? No. Recommend repeat exam, <10 yrs? Polyps Removed Today? No. Recommend repeat exam, <10 yrs? No. ASA CLASS:   Class I INDICATIONS:history of lymphocytic colitis on colonoscopy in 1995. Subsequent colonoscopy in 2008 was normal.  Intermittent diarrhea.  MEDICATIONS: Monitored anesthesia care and Propofol 300 mg IV  DESCRIPTION OF PROCEDURE:   After the risks benefits and alternatives of the procedure were thoroughly explained, informed consent was obtained.  The digital rectal exam revealed no abnormalities of the rectum.   The LB PFC-H190 [id number]  endoscope was introduced through the anus and advanced to the cecum, which was identified by both the appendix and ileocecal valve. No adverse events experienced.   The quality of the prep was excellent, using MoviPrep  The instrument was then slowly withdrawn as the colon was fully examined.      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  Retroflexed views revealed no abnormalities. The time to cecum=6 minutes 54 seconds.  Withdrawal time=6 minutes 06 seconds.  The scope was withdrawn and the procedure completed. COMPLICATIONS: There  were no immediate complications.  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  Await biopsy results 2.  High fiber diet MiraLAX titrated the dose to bowel habits 3.  Await biopsy results 4.recall colonoscopy pending path report eSigned:  Hart Carwinora M Lajada Janes, MD 07/05/2014 5:05 PM   cc:   PATIENT NAME:  Sue Blair, Hazleigh G MR#: [id number]

## 2014-07-05 NOTE — Progress Notes (Signed)
Called to room to assist during endoscopic procedure.  Patient ID and intended procedure confirmed with present staff. Received instructions for my participation in the procedure from the performing physician.  

## 2014-07-05 NOTE — Patient Instructions (Signed)
HIGH FIBER DIET. DRINK WATER. MIRALAX TITRATED TO BOWEL HABITS.     YOU HAD AN ENDOSCOPIC PROCEDURE TODAY AT THE [facility]: Refer to the procedure report that was given to you for any specific questions about what was found during the examination.  If the procedure report does not answer your questions, please call your gastroenterologist to clarify.  If you requested that your care partner not be given the details of your procedure findings, then the procedure report has been included in a sealed envelope for you to review at your convenience later.  YOU SHOULD EXPECT: Some feelings of bloating in the abdomen. Passage of more gas than usual.  Walking can help get rid of the air that was put into your GI tract during the procedure and reduce the bloating. If you had a lower endoscopy (such as a colonoscopy or flexible sigmoidoscopy) you may notice spotting of blood in your stool or on the toilet paper. If you underwent a bowel prep for your procedure, then you may not have a normal bowel movement for a few days.  DIET: Your first meal following the procedure should be a light meal and then it is ok to progress to your normal diet.  A half-sandwich or bowl of soup is an example of a good first meal.  Heavy or fried foods are harder to digest and may make you feel nauseous or bloated.  Likewise meals heavy in dairy and vegetables can cause extra gas to form and this can also increase the bloating.  Drink plenty of fluids but you should avoid alcoholic beverages for 24 hours.  ACTIVITY: Your care partner should take you home directly after the procedure.  You should plan to take it easy, moving slowly for the rest of the day.  You can resume normal activity the day after the procedure however you should NOT DRIVE or use heavy machinery for 24 hours (because of the sedation medicines used during the test).    SYMPTOMS TO REPORT IMMEDIATELY: A gastroenterologist can be reached at any hour.   During normal business hours, 8:30 AM to 5:00 PM Monday through Friday, call [phone].  After hours and on weekends, please call the GI answering service at [phone] who will take a message and have the physician on call contact you.   Following lower endoscopy (colonoscopy or flexible sigmoidoscopy):  Excessive amounts of blood in the stool  Significant tenderness or worsening of abdominal pains  Swelling of the abdomen that is new, acute  Fever of 100F or higher  FOLLOW UP: If any biopsies were taken you will be contacted by phone or by letter within the next 1-3 weeks.  Call your gastroenterologist if you have not heard about the biopsies in 3 weeks.  Our staff will call the home number listed on your records the next business day following your procedure to check on you and address any questions or concerns that you may have at that time regarding the information given to you following your procedure. This is a courtesy call and so if there is no answer at the home number and we have not heard from you through the emergency physician on call, we will assume that you have returned to your regular daily activities without incident.  SIGNATURES/CONFIDENTIALITY: You and/or your care partner have signed paperwork which will be entered into your electronic medical record.  These signatures attest to the fact that that the information above on your After Visit Summary  has been reviewed and is understood.  Full responsibility of the confidentiality of this discharge information lies with you and/or your care-partner. 

## 2014-07-06 ENCOUNTER — Telehealth: Payer: Self-pay | Admitting: *Deleted

## 2014-07-06 NOTE — Telephone Encounter (Signed)
  Follow up Call-  Call back number 07/05/2014  Post procedure Call Back phone  # [phone]  Permission to leave phone message Yes     Patient questions:  Do you have a fever, pain , or abdominal swelling? No. Pain Score  0 *  Have you tolerated food without any problems? Yes.    Have you been able to return to your normal activities? Yes.    Do you have any questions about your discharge instructions: Diet   No. Medications  No. Follow up visit  No.  Do you have questions or concerns about your Care? No.  Actions: * If pain score is 4 or above: No action needed, pain <4. Patient did state her stomach is stil "rumbling", denies pain or bleeding. Encouraged warm fluids. patient agreed

## 2014-07-12 ENCOUNTER — Encounter: Payer: Self-pay | Admitting: Internal Medicine

## 2014-11-02 ENCOUNTER — Other Ambulatory Visit: Payer: Self-pay | Admitting: Obstetrics & Gynecology

## 2014-11-02 DIAGNOSIS — N6310 Unspecified lump in the right breast, unspecified quadrant: Secondary | ICD-10-CM

## 2014-11-13 ENCOUNTER — Other Ambulatory Visit: Payer: Self-pay | Admitting: Obstetrics & Gynecology

## 2014-11-13 DIAGNOSIS — N6311 Unspecified lump in the right breast, upper outer quadrant: Secondary | ICD-10-CM

## 2014-11-13 DIAGNOSIS — Z803 Family history of malignant neoplasm of breast: Secondary | ICD-10-CM

## 2014-11-15 ENCOUNTER — Ambulatory Visit
Admission: RE | Admit: 2014-11-15 | Discharge: 2014-11-15 | Disposition: A | Discharge status: Home / Self Care | Payer: BC Managed Care – PPO | Source: Ambulatory Visit | Attending: Obstetrics & Gynecology | Admitting: Obstetrics & Gynecology

## 2014-11-15 DIAGNOSIS — N6310 Unspecified lump in the right breast, unspecified quadrant: Secondary | ICD-10-CM

## 2014-11-15 DIAGNOSIS — N6311 Unspecified lump in the right breast, upper outer quadrant: Secondary | ICD-10-CM

## 2014-11-15 DIAGNOSIS — Z803 Family history of malignant neoplasm of breast: Secondary | ICD-10-CM

## 2014-11-28 ENCOUNTER — Other Ambulatory Visit (INDEPENDENT_AMBULATORY_CARE_PROVIDER_SITE_OTHER): Payer: BC Managed Care – PPO

## 2014-11-28 DIAGNOSIS — E538 Deficiency of other specified B group vitamins: Secondary | ICD-10-CM | POA: Diagnosis not present

## 2014-11-28 LAB — VITAMIN B12: Vitamin B-12: 446 pg/mL (ref 211–911)

## 2014-11-29 ENCOUNTER — Other Ambulatory Visit: Payer: Self-pay | Admitting: *Deleted

## 2014-11-29 DIAGNOSIS — E538 Deficiency of other specified B group vitamins: Secondary | ICD-10-CM

## 2014-12-14 ENCOUNTER — Encounter: Payer: BC Managed Care – PPO | Admitting: Radiology

## 2014-12-14 ENCOUNTER — Encounter: Payer: BC Managed Care – PPO | Admitting: Neurology

## 2014-12-19 ENCOUNTER — Encounter: Payer: BC Managed Care – PPO | Admitting: Radiology

## 2015-01-10 ENCOUNTER — Other Ambulatory Visit: Payer: Self-pay | Admitting: Internal Medicine

## 2015-01-25 ENCOUNTER — Encounter: Payer: BC Managed Care – PPO | Admitting: Neurology

## 2015-01-29 ENCOUNTER — Ambulatory Visit (INDEPENDENT_AMBULATORY_CARE_PROVIDER_SITE_OTHER): Payer: BC Managed Care – PPO | Admitting: Neurology

## 2015-01-29 ENCOUNTER — Ambulatory Visit (INDEPENDENT_AMBULATORY_CARE_PROVIDER_SITE_OTHER): Payer: Self-pay | Admitting: Neurology

## 2015-01-29 DIAGNOSIS — R202 Paresthesia of skin: Secondary | ICD-10-CM

## 2015-01-29 DIAGNOSIS — Z0289 Encounter for other administrative examinations: Secondary | ICD-10-CM

## 2015-01-29 NOTE — Progress Notes (Signed)
  [facility]    Provider:  Dr Lucia GaskinsAhern Referring Provider: Creola Cornusso, John, MD Primary Care Physician:  Gwen PoundsUSSO,JOHN M, MD  HPI:  Sue Blair is a 49 y.o. female here as a referral from Dr. Timothy Lassousso for paresthesias. Symptoms started in April. Painful burning and tingling in the hands and feet. Symptoms have significantly improved since decreasing Lamotrigine. She has a PMHx of B12 deficiency. She still has some mild tingling only in the toes and bottom of the feet, symmetric bilaterally.  Summary  Nerve conduction studies were performed on the bilateral upper and right lower extremities:  The bilateral Median motor nerves showed normal conductions with normal F Wave latencies The bilateral Ulnar motor nerves showed normal conductions with normal F Wave latencies The right Peroneal motor nerve showed normal conductions with normal F Wave latency The right Tibial motor nerve showed normal conductions with normal F Wave latency The bilateral second-digit Median sensory nerves were within normal limits The bilateral fifth-digit Ulnar sensory nerves were within normal limits The right Sural sensory nerve was within normal limits Bilateral H Reflexes showed normal latencies   EMG Needle study was performed on selected right lower extremity muscles:   The Vastus Medialis, Anterior Tibialis, Medial Gastrocnemius, Extensor Hallucis Longus and Abductor Hallucis muscles were within normal limits.  Conclusion: This is a normal study. No electrophysiologic evidence for ulnar or median neuropathy, peripheral large-fiber polyneuropathy or radiculopathy. Clinical correlation recommended.   Sue DeanAntonia Ahern, MD  [facility] [address] [place], [place] [id number]  Phone [phone] Fax [phone]

## 2015-01-29 NOTE — Progress Notes (Signed)
See procedure note.

## 2015-01-29 NOTE — Procedures (Signed)
[facility]    Provider:  Dr Lucia GaskinsAhern Referring Provider: Creola Cornusso, John, MD Primary Care Physician:  Gwen PoundsUSSO,JOHN M, MD  HPI:  Sue Blair is a 49 y.o. female here as a referral from Dr. Timothy Lassousso for paresthesias. Symptoms started in April. Painful burning and tingling in the hands and feet. Symptoms have significantly improved since decreasing Lamotrigine. She has a PMHx of B12 deficiency. She still has some mild tingling only in the toes and bottom of the feet, symmetric bilaterally.  Summary  Nerve conduction studies were performed on the bilateral upper and right lower extremities:  The bilateral Median motor nerves showed normal conductions with normal F Wave latencies The bilateral Ulnar motor nerves showed normal conductions with normal F Wave latencies The right Peroneal motor nerve showed normal conductions with normal F Wave latency The right Tibial motor nerve showed normal conductions with normal F Wave latency The bilateral second-digit Median sensory nerves were within normal limits The bilateral fifth-digit Ulnar sensory nerves were within normal limits The right Sural sensory nerve was within normal limits Bilateral H Reflexes showed normal latencies   EMG Needle study was performed on selected right lower extremity muscles:   The Vastus Medialis, Anterior Tibialis, Medial Gastrocnemius, Extensor Hallucis Longus and Abductor Hallucis muscles were within normal limits.  Conclusion: This is a normal study. No electrophysiologic evidence for ulnar or median neuropathy, peripheral large-fiber polyneuropathy or radiculopathy. Clinical correlation recommended.   Naomie DeanAntonia Delise Simenson, MD  [facility] [address] [place], [place] [id number]  Phone [phone] Fax [phone]

## 2015-03-29 ENCOUNTER — Telehealth: Payer: Self-pay | Admitting: Internal Medicine

## 2015-03-29 NOTE — Telephone Encounter (Signed)
Patient states she is having dysphagia. Would like an OV. Scheduled with Dr. Adela Lank on 03/30/15 at 9:00 AM.

## 2015-03-30 ENCOUNTER — Encounter: Payer: Self-pay | Admitting: Gastroenterology

## 2015-03-30 ENCOUNTER — Ambulatory Visit (INDEPENDENT_AMBULATORY_CARE_PROVIDER_SITE_OTHER): Payer: BC Managed Care – PPO | Admitting: Gastroenterology

## 2015-03-30 VITALS — BP 100/70 | HR 80 | Ht 66.0 in | Wt 131.6 lb

## 2015-03-30 DIAGNOSIS — E538 Deficiency of other specified B group vitamins: Secondary | ICD-10-CM | POA: Diagnosis not present

## 2015-03-30 DIAGNOSIS — K219 Gastro-esophageal reflux disease without esophagitis: Secondary | ICD-10-CM

## 2015-03-30 DIAGNOSIS — R1310 Dysphagia, unspecified: Secondary | ICD-10-CM

## 2015-03-30 DIAGNOSIS — K589 Irritable bowel syndrome without diarrhea: Secondary | ICD-10-CM | POA: Diagnosis not present

## 2015-03-30 MED ORDER — DICYCLOMINE HCL 20 MG PO TABS: 20.0000 mg | ORAL_TABLET | Freq: Three times a day (TID) | ORAL | Status: DC

## 2015-03-30 MED ORDER — OMEPRAZOLE 20 MG PO CPDR: 20.0000 mg | DELAYED_RELEASE_CAPSULE | Freq: Every day | ORAL | Status: DC

## 2015-03-30 MED ORDER — CYANOCOBALAMIN 1000 MCG/ML IJ SOLN: 1000.0000 ug | INTRAMUSCULAR | Status: AC

## 2015-03-30 NOTE — Progress Notes (Signed)
Patient ID: Sue Blair, female   DOB: 15-Dec-1965, 49 y.o.   MRN: [id number]   HPI :  49 y/o female with a history of GERD and IBS-D, here for evaluation of dysphagia. She endorses solid food dysphagia for the past year or so in general, although has bothered her more significantly over the past 6 months. No dysphagia to liquids, no odynophagia. She reports when swallowing breads, meats, or any dry foods, she will feel it get "hung up" in her lower chest before eventually passing on its own.  It initially occurred intermittently but has progressed to where it will occur daily at this time. She has no history of food impactions, but has had multiple episodes of inducing vomiting to relieve her symptoms. She reports a remote endoscopy several years ago with dilation of her esophagus for similar symptoms, she thinks about 10 years ago. 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   She otherwise endorses intermittent heartburn which has worsened over this time as well. She has historically had some benefit to low dose Prilosec but has not taken it in a long time. She uses pepsid OTC which helps her symptoms when she has them. She has heartburn that bothers her a few times per week. She has no weight loss.  She otherwise endorses longstanding IBS symptoms. She has occasional lower abdominal cramps relieved with taking bentyl PRN. She normally has regular BMs, however can have some loose stools when she has abdominal cramps. She has had a colonoscopy in 2015 which was normal with normal biopsies. Her chart shows a history of lymphocytic colitis in 1995 but has not had any recurrence of this on colonoscopy in 2008 and 2015. She reports her IBS is well controlled at this time on her current regimen.   She also is requesting a B12 refill today. She has had EGDs in the past and autoimmune gastritis lab workup  done for this and has been negative to date. She has responded well to B12 therapy in regards to normalization of her labs.    Past Medical History  Diagnosis Date  . Hiatal hernia 06/17/2007  . GERD (gastroesophageal reflux disease) 06/17/2007  . Osteopenia   . Anxiety   . Chronic headaches   . History of colon polyps   . IBS (irritable bowel syndrome)   . Colitis     lymphocytic colitis - resolved  . Gastric polyp   B12 deficiency with normal IF and parietal cell ABs Parasthesias - hands and feet   Past Surgical History  Procedure Laterality Date  . Cesarean section  1998  . Breast enhancement surgery    . Colonoscopy    . Polypectomy    . Laparoscopic assisted vaginal hysterectomy  10-2013   Family History  Problem Relation Age of Onset  . Breast cancer Mother   . Breast cancer Maternal Aunt   . Breast cancer Maternal Grandmother   . Colon cancer Maternal Uncle   . Colon cancer Paternal Uncle   . Crohn's disease Paternal Uncle   . Diabetes Father   . Esophageal cancer Neg Hx   . Rectal cancer Neg Hx   . Stomach cancer Neg Hx    Social History  Substance Use Topics  . Smoking status: Former Games developer  . Smokeless tobacco: Never Used  . Alcohol Use: No   Current Outpatient Prescriptions  Medication Sig Dispense Refill  . ALPRAZolam (XANAX) 0.5 MG tablet Take 0.5 mg by mouth at bedtime as needed.    . citalopram (CELEXA) 40 MG tablet     . cyanocobalamin (,VITAMIN B-12,) 1000 MCG/ML injection Inject 1 mL (1,000 mcg total) into the muscle every 30 (thirty) days. 12 mL 0  . dicyclomine (BENTYL) 20 MG tablet Take 1 tablet (20 mg total) by mouth 3 (three) times daily before meals. Pharmacy-please d/c rx for levsin. 90 tablet 4  . estradiol (VIVELLE-DOT) 0.05 MG/24HR patch Place 1 patch onto the skin 2 (two) times a week.    . lamoTRIgine (LAMICTAL) 100 MG tablet Take 250 mg by mouth daily.    Marland Kitchen omeprazole (PRILOSEC) 20 MG capsule Take 1 capsule (20 mg total) by mouth  daily. 90 capsule 3   No current facility-administered medications for this visit.   Allergies  Allergen Reactions  . Clarithromycin   . Sulfonamide Derivatives      Review of Systems: Parasthesias in hands and feet, seeing neurology. All systems reviewed and negative except where noted in HPI.    Physical Exam: BP 100/70 mmHg  Pulse 80  Ht  (1.676 m)  Wt 131 lb 9.6 oz (59.693 kg)  BMI 21.25 kg/m2  LMP 04/14/2011 Constitutional: Pleasant,well-developed, female in no acute distress. HEENT: Normocephalic and atraumatic. Conjunctivae are normal. No scleral icterus. Neck supple.  Cardiovascular: Normal rate, regular rhythm.  Pulmonary/chest: Effort normal and breath sounds normal. No wheezing, rales or rhonchi. Abdominal: Soft, nondistended, nontender. Bowel sounds active throughout. There are no masses palpable. No hepatomegaly. Extremities: no edema Lymphadenopathy: No cervical adenopathy noted. Neurological: Alert and oriented to person place and time. Skin: Skin is warm and dry. No rashes noted. Psychiatric: Normal mood and affect. Behavior is normal.   ASSESSMENT AND PLAN: Dysphagia / GERD 49 y/o female with history of dysphagia which was relieved with dilation > 10 years ago (old report not available), now with recurrence of solid food dysphagia over the past 6 months. Based on most recent EGD in 2008 suspect this is likely related to previously noted ring at the GEJ, although discussed the full differential for dysphagia with her. Recommend at this time we proceed with upper endoscopy to diagnosis etiology, and potentially treat the suspected stricture with dilation. In the interim, recommend she start omeprazole  daily for treatment of her reflux which is not well controlled and likely contributing to the underlying pathology as well. She agreed and will start omeprazole and follow up for her EGD.   The indications, risks, and benefits of EGD were explained to the  patient in detail. Risks include but not limited to bleeding, perforation, adverse reaction to medications, cardiopulmonary compromise. Sequelae include but not limited to need for surgery, hospitalization, blood transfusion, disability, morbidity, and mortality was explained. Patient verbalized understanding and wished to proceed. All questions answered, referred to scheduler. Further recommendations pending results of the exam.   IBS - history as above. Well controlled with bentyl PRN and anxiety regimen. Most recent colonoscopy in 2015 showed no recurrence of microscopic colitis. Celiac panel negative. Follow up as needed for this issue.   B12 deficiency - no clear etiology noted on GI workup to date, she will continue supplementation indefinitely.   Ileene Patrick, MD Gastroenterology

## 2015-03-30 NOTE — Patient Instructions (Signed)
You have been scheduled for an endoscopy. Please follow written instructions given to you at your visit today. If you use inhalers (even only as needed), please bring them with you on the day of your procedure. Your physician has requested that you go to www.startemmi.com and enter the access code given to you at your visit today. This web site gives a general overview about your procedure. However, you should still follow specific instructions given to you by our office regarding your preparation for the procedure.   We have sent medications to your pharmacy for you to pick up at your convenience.  

## 2015-03-30 NOTE — Progress Notes (Deleted)
Patient ID: Sue Blair, female   DOB: October 12, 1965, 49 y.o.   MRN: [id number]   Subjective:    Patient ID: Sue Blair, female    DOB: [birth year], 50 y.o.   MRN: [id number]  HPI ***  Review of Systems  Outpatient Encounter Prescriptions as of 03/30/2015  Medication Sig  . ALPRAZolam (XANAX) 0.5 MG tablet Take 0.5 mg by mouth at bedtime as needed.  . citalopram (CELEXA) 40 MG tablet   . cyanocobalamin (,VITAMIN B-12,) 1000 MCG/ML injection Inject 1 mL (1,000 mcg total) into the muscle every 30 (thirty) days.  Marland Kitchen dicyclomine (BENTYL) 20 MG tablet Take 1 tablet (20 mg total) by mouth 3 (three) times daily before meals. Pharmacy-please d/c rx for levsin.  Marland Kitchen estradiol (VIVELLE-DOT) 0.05 MG/24HR patch Place 1 patch onto the skin 2 (two) times a week.  . lamoTRIgine (LAMICTAL) 100 MG tablet Take 250 mg by mouth daily.  . [DISCONTINUED] cyanocobalamin (,VITAMIN B-12,) 1000 MCG/ML injection INJECT 1 ML (1,000 MCG TOTAL) INTO THE MUSCLE EVERY 30 (THIRTY) DAYS.  . [DISCONTINUED] dicyclomine (BENTYL) 20 MG tablet Take 1 tablet (20 mg total) by mouth 3 (three) times daily before meals. Pharmacy-please d/c rx for levsin.  Marland Kitchen omeprazole (PRILOSEC) 20 MG capsule Take 1 capsule (20 mg total) by mouth daily.  . [DISCONTINUED] aspirin 81 MG tablet Take 81 mg by mouth daily.  . [DISCONTINUED] lamoTRIgine (LAMICTAL) 150 MG tablet Take 150 mg by mouth daily.   No facility-administered encounter medications on file as of 03/30/2015.   Allergies  Allergen Reactions  . Clarithromycin   . Sulfonamide Derivatives    Patient Active Problem List   Diagnosis Date Noted  . Paresthesias 01/29/2015  . Constipation 01/04/2012  . Internal hemorrhoids 01/04/2012  . ANEMIA, B12 DEFICIENCY 05/29/2008  . ANXIETY 08/20/2007  . COLITIS 08/20/2007  . IRRITABLE BOWEL SYNDROME 08/20/2007  . HEMOCCULT POSITIVE STOOL 08/20/2007  . OSTEOPENIA 08/20/2007  . ALLERGY 08/20/2007   Social History   Social History  . Marital  Status: Married    Spouse Name: N/A  . Number of Children: 2  . Years of Education: N/A   Occupational History  . office manager    Social History Main Topics  . Smoking status: Former Games developer  . Smokeless tobacco: Never Used  . Alcohol Use: No  . Drug Use: No  . Sexual Activity: Not on file   Other Topics Concern  . Not on file   Social History Narrative    Ms. Schomburg's family history includes Breast cancer in her maternal aunt, maternal grandmother, and mother; Colon cancer in her maternal uncle and paternal uncle; Crohn's disease in her paternal uncle; Diabetes in her father. There is no history of Esophageal cancer, Rectal cancer, or Stomach cancer.      Objective:    Filed Vitals:   03/30/15 0915  BP: 100/70  Pulse: 80    Physical Exam       Assessment & Plan:     Reeves Forth Armbruster MD 03/30/2015   Cc: Creola Corn, MD

## 2015-03-30 NOTE — Progress Notes (Deleted)
Patient ID: Sue Blair, female   DOB: July 05, 1966, 49 y.o.   MRN: [id number]

## 2015-03-30 NOTE — Progress Notes (Deleted)
Patient ID: Sue Blair, female   DOB: 1966/01/24, 49 y.o.   MRN: [id number]   Subjective:    Patient ID: Sue Blair, female    DOB: Sep 19, 1965, 49 y.o.   MRN: [id number]  HPI ***  Review of Systems Pertinent positive and negative review of systems were noted in the above HPI section.  All other review of systems was otherwise negative.  Outpatient Encounter Prescriptions as of 03/30/2015  Medication Sig  . ALPRAZolam (XANAX) 0.5 MG tablet Take 0.5 mg by mouth at bedtime as needed.  Marland Kitchen aspirin 81 MG tablet Take 81 mg by mouth daily.  . citalopram (CELEXA) 40 MG tablet   . cyanocobalamin (,VITAMIN B-12,) 1000 MCG/ML injection INJECT 1 ML (1,000 MCG TOTAL) INTO THE MUSCLE EVERY 30 (THIRTY) DAYS.  Marland Kitchen dicyclomine (BENTYL) 20 MG tablet Take 1 tablet (20 mg total) by mouth 3 (three) times daily before meals. Pharmacy-please d/c rx for levsin. (Patient not taking: Reported on 07/05/2014)  . estradiol (VIVELLE-DOT) 0.05 MG/24HR patch Place 1 patch onto the skin 2 (two) times a week.  . lamoTRIgine (LAMICTAL) 150 MG tablet Take 150 mg by mouth daily.   No facility-administered encounter medications on file as of 03/30/2015.   Allergies  Allergen Reactions  . Clarithromycin   . Sulfonamide Derivatives    Patient Active Problem List   Diagnosis Date Noted  . Paresthesias 01/29/2015  . Constipation 01/04/2012  . Internal hemorrhoids 01/04/2012  . ANEMIA, B12 DEFICIENCY 05/29/2008  . ANXIETY 08/20/2007  . COLITIS 08/20/2007  . IRRITABLE BOWEL SYNDROME 08/20/2007  . HEMOCCULT POSITIVE STOOL 08/20/2007  . OSTEOPENIA 08/20/2007  . ALLERGY 08/20/2007   Social History   Social History  . Marital Status: Married    Spouse Name: N/A  . Number of Children: 2  . Years of Education: N/A   Occupational History  . office manager    Social History Main Topics  . Smoking status: Former Games developer  . Smokeless tobacco: Never Used  . Alcohol Use: No  . Drug Use: No  . Sexual Activity: Not on file    Other Topics Concern  . Not on file   Social History Narrative    [name]'s family history includes Breast cancer in her maternal aunt, maternal grandmother, and mother; Colon cancer in her maternal uncle and paternal uncle; Crohn's disease in her paternal uncle; Diabetes in her father. There is no history of Esophageal cancer, Rectal cancer, or Stomach cancer.      Objective:    There were no vitals filed for this visit.  Physical Exam       Assessment & Plan:     Reeves Forth Armbruster MD 03/30/2015   Cc: Creola Corn, MD

## 2015-04-03 ENCOUNTER — Encounter: Payer: BC Managed Care – PPO | Admitting: Gastroenterology

## 2015-04-06 ENCOUNTER — Encounter: Payer: BC Managed Care – PPO | Admitting: Gastroenterology

## 2015-05-03 ENCOUNTER — Telehealth: Payer: Self-pay | Admitting: Gastroenterology

## 2015-05-03 NOTE — Telephone Encounter (Signed)
Regina I received a letter from [facility] regarding this patient's omeprazole prescription (for ). She is taking celexa at  daily and omeprazole can increase celexa levels, compared to other PPIs. In this light, recommend switching her to protonix  daily and stopping omeprazole. If for some reason this costs more or not covered, she should decrease celexa to  daily if taking omeprazole. Can you let her know and assist with ordering this? thanks

## 2015-05-04 MED ORDER — PANTOPRAZOLE SODIUM 20 MG PO TBEC: 20.0000 mg | DELAYED_RELEASE_TABLET | Freq: Every day | ORAL | Status: DC

## 2015-05-04 NOTE — Telephone Encounter (Signed)
Left a message for patient to call back. 

## 2015-05-04 NOTE — Telephone Encounter (Signed)
Patient given recommendations. Rx sent for Protonix 20 mg daily.

## 2015-05-04 NOTE — Telephone Encounter (Signed)
Spoke with patient and she is asking if she can use an OTC medication for reflux and use it prn. She states she cannot go down on Celexa because of her depression. Please, advise.

## 2015-05-04 NOTE — Telephone Encounter (Signed)
That's fine. I do think low dose omeprazole is fine at  dose (warning is for higher than  dose). She could also try prevacid or nexium OTC, which has less interaction than the omeprazole, but suspect low dose protonix as prescribed is the cheaper option. Thanks

## 2015-05-07 ENCOUNTER — Ambulatory Visit (AMBULATORY_SURGERY_CENTER): Payer: BC Managed Care – PPO | Admitting: Gastroenterology

## 2015-05-07 ENCOUNTER — Encounter: Payer: Self-pay | Admitting: Gastroenterology

## 2015-05-07 VITALS — BP 97/63 | HR 63 | Temp 96.9°F | Resp 17 | Ht 66.0 in | Wt 131.0 lb

## 2015-05-07 DIAGNOSIS — R1310 Dysphagia, unspecified: Secondary | ICD-10-CM | POA: Diagnosis not present

## 2015-05-07 DIAGNOSIS — K317 Polyp of stomach and duodenum: Secondary | ICD-10-CM

## 2015-05-07 MED ORDER — SODIUM CHLORIDE 0.9 % IV SOLN: 500.0000 mL | INTRAVENOUS | Status: DC

## 2015-05-07 NOTE — Progress Notes (Signed)
To recovery, report to Hodges, RN, VSS 

## 2015-05-07 NOTE — Progress Notes (Signed)
Called to room to assist during endoscopic procedure.  Patient ID and intended procedure confirmed with present staff. Received instructions for my participation in the procedure from the performing physician.  

## 2015-05-07 NOTE — Op Note (Signed)
[facility] [address] [place], [address]   ENDOSCOPY PROCEDURE REPORT  PATIENT: Sue, Blair  MR#: [id number] BIRTHDATE: 1966/07/14 , 49  yrs. old GENDER: female ENDOSCOPIST: Benancio Deeds, MD REFERRED BY: PROCEDURE DATE:  05/07/2015 PROCEDURE:  EGD w/ biopsy ASA CLASS:     Class II INDICATIONS:  dysphagia. MEDICATIONS: Propofol 200 mg IV TOPICAL ANESTHETIC:  DESCRIPTION OF PROCEDURE: After the risks benefits and alternatives of the procedure were thoroughly explained, informed consent was obtained.  The LB [id number] endoscope was introduced through the mouth and advanced to the second portion of the duodenum , Without limitations.  The instrument was slowly withdrawn as the mucosa was fully examined.  FINDINGS: The mid and proximal esophagus were normal.  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  DH noted at 36-37cm from the incisors with a 1-2cm hiatal hernia.  The stomach was remarkable for multiple diminutive (few mm in size) gastric polyps in the gastric body and fundus, 2 of which were removed with cold forceps with minimal blood loss.  The remainder of the examined stomach was normal.  The duodenal bulb and 2nd portion of the duodenum were normal.  Retroflexed views revealed no abnormalities.     The scope was then withdrawn from the patient and the procedure completed.  COMPLICATIONS: There were no immediate complications.  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 Normal remainder examined stomach and duodenum.  RECOMMENDATIONS: Resume diet Resume medications Await pathology results If dysphagia recurs in the future please contact us  for reassessment. Given symptoms have resolved on PPI and Shatski ring was widelty patent, dilation was not performed today but can reasess this issue in the future if symptoms recur  eSigned:  Benancio Deeds, MD 05/07/2015 11:02 AM    CC: the patient  PATIENT NAME:  Sue, Blair MR#: [id number]

## 2015-05-07 NOTE — Patient Instructions (Signed)
YOU HAD AN ENDOSCOPIC PROCEDURE TODAY AT THE [facility]:   Refer to the procedure report that was given to you for any specific questions about what was found during the examination.  If the procedure report does not answer your questions, please call your gastroenterologist to clarify.  If you requested that your care partner not be given the details of your procedure findings, then the procedure report has been included in a sealed envelope for you to review at your convenience later.  YOU SHOULD EXPECT: Some feelings of bloating in the abdomen. Passage of more gas than usual.  Walking can help get rid of the air that was put into your GI tract during the procedure and reduce the bloating.  Please Note:  You might notice some irritation and congestion in your nose or some drainage.  This is from the oxygen used during your procedure.  There is no need for concern and it should clear up in a day or so.  SYMPTOMS TO REPORT IMMEDIATELY:    Following upper endoscopy (EGD)  Vomiting of blood or coffee ground material  New chest pain or pain under the shoulder blades  Painful or persistently difficult swallowing  New shortness of breath  Fever of 100F or higher  Black, tarry-looking stools  For urgent or emergent issues, a gastroenterologist can be reached at any hour by calling [phone].   DIET: Your first meal following the procedure should be a small meal and then it is ok to progress to your normal diet. Heavy or fried foods are harder to digest and may make you feel nauseous or bloated.  Likewise, meals heavy in dairy and vegetables can increase bloating.  Drink plenty of fluids but you should avoid alcoholic beverages for 24 hours.  ACTIVITY:  You should plan to take it easy for the rest of today and you should NOT DRIVE or use heavy machinery until tomorrow (because of the sedation medicines used during the test).    FOLLOW UP: Our staff will call the number listed  on your records the next business day following your procedure to check on you and address any questions or concerns that you may have regarding the information given to you following your procedure. If we do not reach you, we will leave a message.  However, if you are feeling well and you are not experiencing any problems, there is no need to return our call.  We will assume that you have returned to your regular daily activities without incident.  If any biopsies were taken you will be contacted by phone or by letter within the next 1-3 weeks.  Please call us at [phone] if you have not heard about the biopsies in 3 weeks.    SIGNATURES/CONFIDENTIALITY: You and/or your care partner have signed paperwork which will be entered into your electronic medical record.  These signatures attest to the fact that that the information above on your After Visit Summary has been reviewed and is understood.  Full responsibility of the confidentiality of this discharge information lies with you and/or your care-partner.  Read all handouts given to you by your recovery room nurse.

## 2015-05-08 ENCOUNTER — Telehealth: Payer: Self-pay | Admitting: *Deleted

## 2015-05-08 NOTE — Telephone Encounter (Signed)
  Follow up Call-  Call back number 05/07/2015 07/05/2014  Post procedure Call Back phone  # [phone]  Permission to leave phone message Yes Yes     Patient questions:  Do you have a fever, pain , or abdominal swelling? No. Pain Score  0 *  Have you tolerated food without any problems? Yes.    Have you been able to return to your normal activities? yes  Do you have any questions about your discharge instructions: Diet   No. Medications  No. Follow up visit  No.  Do you have questions or concerns about your Care? no  Actions: * If pain score is 4 or above: No action needed, pain <4.  Pt. Complains that left side of nose has been burning since oxygen tubing was inserted yesterday.  Advised that this is often a side effect of 02 tubing and that it should be resolved sometime today or tomorrow.

## 2015-05-09 ENCOUNTER — Telehealth: Payer: Self-pay | Admitting: Gastroenterology

## 2015-05-09 NOTE — Telephone Encounter (Signed)
Called pt and she states her insurance did not receive a prior auth form but it was completed on October 3rd on covermy meds. Pt gave me the number to her insurance company I will call and see if they will fax me a form. I will back patient back after I hear a response.

## 2015-05-11 NOTE — Telephone Encounter (Signed)
Called pt and informed her of denial of Protonix. I have sent Dr. Adela Lank a message to see if there is an alternative.

## 2015-05-15 NOTE — Telephone Encounter (Signed)
Dr. Adela Lank suggests patient takes omeprazole as alternative. Called pt and explained this.

## 2015-06-20 ENCOUNTER — Encounter: Payer: BC Managed Care – PPO | Admitting: Gastroenterology

## 2015-07-19 ENCOUNTER — Other Ambulatory Visit: Payer: Self-pay

## 2015-07-19 DIAGNOSIS — Z1231 Encounter for screening mammogram for malignant neoplasm of breast: Secondary | ICD-10-CM

## 2015-08-17 ENCOUNTER — Ambulatory Visit
Admission: RE | Admit: 2015-08-17 | Discharge: 2015-08-17 | Disposition: A | Discharge status: Home / Self Care | Payer: BC Managed Care – PPO | Source: Ambulatory Visit

## 2015-08-17 DIAGNOSIS — Z1231 Encounter for screening mammogram for malignant neoplasm of breast: Secondary | ICD-10-CM

## 2015-10-22 ENCOUNTER — Telehealth: Payer: Self-pay | Admitting: Gastroenterology

## 2015-10-22 NOTE — Telephone Encounter (Signed)
Is she able to keep any fluids or solids down? I'm not sure what to make of her symptoms. She had a relatively normal EGD in October with mild esophagitis and mild Shatski ring. Does this occur immediately after eating or hours later? Any nausea with this? Is she sure this isn't dysphagia? I would recommend a liquid diet today and see how she does. If symptoms persist or she can't progress her diet we can see her in the office for reassessment or consider repeat EGD. Thanks

## 2015-10-22 NOTE — Telephone Encounter (Signed)
Patient calling to report over the weekend, she started having a feeling that food is stuck in her stomach. States food goes down ok without any problems but it feels like food is stuck in her stomach and she vomits. States the funny feeling in her stomach is there even when she is not eating. She states her bowels are moving ok. She reports the food is not sticking in her throat just her stomach. She does not feel this is a virus.

## 2015-10-22 NOTE — Telephone Encounter (Signed)
Patient is keeping fluids down. She states she does not have nausea or any problems swallowing. She will try the liquid diet x 24 hours. She will call back if not able to advance diet.

## 2015-11-20 ENCOUNTER — Other Ambulatory Visit: Payer: Self-pay | Admitting: Gastroenterology

## 2015-11-22 ENCOUNTER — Telehealth: Payer: Self-pay | Admitting: *Deleted

## 2015-11-22 DIAGNOSIS — E538 Deficiency of other specified B group vitamins: Secondary | ICD-10-CM

## 2015-11-22 NOTE — Telephone Encounter (Signed)
-----   Message from Daphine Deutscheregina N Tranell Wojtkiewicz, RN sent at 04/03/2015  3:29 PM EDT ----- Adela LankArmbruster ----- Message -----    From: Daphine Deutscheregina N Nazaria Riesen, RN    Sent: 11/22/2015      To: Daphine Deutscheregina N Joceline Hinchcliff, RN  Call and remind patient B12 level due for DB on 11/26/15. Lab in EPIC.

## 2015-11-22 NOTE — Telephone Encounter (Signed)
Yes that's fine we can order it for her, thanks

## 2015-11-22 NOTE — Telephone Encounter (Signed)
Order in EPIC. Left a message for patient to call back. 

## 2015-11-22 NOTE — Telephone Encounter (Signed)
Former Psychologist, counsellingBrodie patient. I have a note that she is due for a yearly B12 lab. Do you want this lab done?

## 2015-11-22 NOTE — Telephone Encounter (Signed)
Patient notified and she will come for labs. 

## 2015-12-27 ENCOUNTER — Ambulatory Visit (INDEPENDENT_AMBULATORY_CARE_PROVIDER_SITE_OTHER): Payer: BC Managed Care – PPO | Admitting: Gastroenterology

## 2015-12-27 ENCOUNTER — Other Ambulatory Visit (INDEPENDENT_AMBULATORY_CARE_PROVIDER_SITE_OTHER): Payer: BC Managed Care – PPO

## 2015-12-27 ENCOUNTER — Encounter: Payer: Self-pay | Admitting: Gastroenterology

## 2015-12-27 VITALS — BP 98/70 | HR 78 | Ht 65.5 in | Wt 137.0 lb

## 2015-12-27 DIAGNOSIS — K589 Irritable bowel syndrome without diarrhea: Secondary | ICD-10-CM | POA: Diagnosis not present

## 2015-12-27 DIAGNOSIS — R0989 Other specified symptoms and signs involving the circulatory and respiratory systems: Secondary | ICD-10-CM

## 2015-12-27 DIAGNOSIS — F458 Other somatoform disorders: Secondary | ICD-10-CM

## 2015-12-27 DIAGNOSIS — E538 Deficiency of other specified B group vitamins: Secondary | ICD-10-CM | POA: Diagnosis not present

## 2015-12-27 DIAGNOSIS — K219 Gastro-esophageal reflux disease without esophagitis: Secondary | ICD-10-CM | POA: Diagnosis not present

## 2015-12-27 LAB — VITAMIN B12: Vitamin B-12: 436 pg/mL (ref 211–911)

## 2015-12-27 MED ORDER — RIFAXIMIN 550 MG PO TABS: 550.0000 mg | ORAL_TABLET | Freq: Three times a day (TID) | ORAL | Status: DC

## 2015-12-27 MED ORDER — RANITIDINE HCL 150 MG PO TABS: 150.0000 mg | ORAL_TABLET | Freq: Two times a day (BID) | ORAL | Status: DC

## 2015-12-27 NOTE — Patient Instructions (Signed)
  We have sent the following medications to your pharmacy for you to pick up at your convenience: Zantac, xifaxan   Today we are providing you with a FODMAP diet handout to read and follow.     I appreciate the opportunity to care for you.

## 2015-12-27 NOTE — Progress Notes (Signed)
HPI :  LAST VISIT: 50 y/o female with a history of GERD and IBS-D, here for evaluation of dysphagia. She endorses solid food dysphagia for the past year or so in general, although has bothered her more significantly over the past 6 months. No dysphagia to liquids, no odynophagia. She reports when swallowing breads, meats, or any dry foods, she will feel it get "hung up" in her lower chest before eventually passing on its own. It initially occurred intermittently but has progressed to where it will occur daily at this time. She has no history of food impactions, but has had multiple episodes of inducing vomiting to relieve her symptoms. She reports a remote endoscopy several years ago with dilation of her esophagus for similar symptoms, she thinks about 10 years ago. 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   She otherwise endorses intermittent heartburn which has worsened over this time as well. She has historically had some benefit to low dose Prilosec but has not taken it in a long time. She uses pepsid OTC which helps her symptoms when she has them. She has heartburn that bothers her a few times per week. She has no weight loss.  She otherwise endorses longstanding IBS symptoms. She has occasional lower abdominal cramps relieved with taking bentyl PRN. She normally has regular BMs, however can have some loose stools when she has abdominal cramps. She has had a colonoscopy in 2015 which was normal with normal biopsies. Her chart shows a history of lymphocytic colitis in 1995 but has not had any recurrence of this on colonoscopy in 2008 and 2015. She reports her IBS is well controlled at this time on her current regimen.   She also is requesting a B12 refill today. She has had EGDs in the past and autoimmune gastritis lab workup done for this and has been negative to date. She has responded  well to B12 therapy in regards to normalization of her labs.   SINCE LAST VISIT:  Patient is here for a follow up visit today. Patient reports around February, she developed a sense of globus ongoing in her throat to through her entire chest. She reports over time it is better than when it first started, but still bothering her. She reports having had some regurgitation of food / reflux, but no dysphagia. She has some postprandial bloating and loose stools. She previously reported having globus present all the time previously. Eating does not change her symptoms. She is taking her prilosec once daily 20mg . This has resolved her dysphagia. She takes celexa so could not increase this dose higher due to medication interaction, we tried to get protonix but insurance would not approve it. She is having some nocturnal reflux symptoms which have bothered her as well. She has been sleeping with head of bed elevated due to symptoms. She denies pyrosis. She does not have any significant tobacco history.   She has some loose stools more so than usual. She is using bentyl PRN. She will have variable stool frequency which can vary. She has some increase urgency after she eats. No blood in the stools. She has symptoms of postprandial bloating as well which bothers her. She has had a normal colonoscopy in 2015 with normal biopsies, and normal biopsies in 2008. Remote history of lymphocytic colitis but not seen on the last 2 exams.   EGD -  05/07/15 - LA grade A Esophagitis, subtle Shatski ring not dilated as dysphagia had resolved.   Past Medical History  Diagnosis Date  . Hiatal hernia 06/17/2007  . GERD (gastroesophageal reflux disease) 06/17/2007  . Osteopenia   . Anxiety   . Chronic headaches   . History of colon polyps   . IBS (irritable bowel syndrome)   . Colitis     lymphocytic colitis - resolved  . Gastric polyp      Past Surgical History  Procedure Laterality Date  . Cesarean section  1998  .  Breast enhancement surgery    . Colonoscopy    . Polypectomy    . Laparoscopic assisted vaginal hysterectomy  10-2013   Family History  Problem Relation Age of Onset  . Breast cancer Mother   . Breast cancer Maternal Aunt   . Breast cancer Maternal Grandmother   . Colon cancer Maternal Uncle   . Colon cancer Paternal Uncle   . Crohn's disease Paternal Uncle   . Diabetes Father   . Esophageal cancer Neg Hx   . Rectal cancer Neg Hx   . Stomach cancer Neg Hx    Social History  Substance Use Topics  . Smoking status: Former Games developermoker  . Smokeless tobacco: Never Used  . Alcohol Use: No   Current Outpatient Prescriptions  Medication Sig Dispense Refill  . ALPRAZolam (XANAX) 0.5 MG tablet Take 0.5 mg by mouth at bedtime as needed.    . citalopram (CELEXA) 40 MG tablet     . cyanocobalamin (,VITAMIN B-12,) 1000 MCG/ML injection Inject 1 mL (1,000 mcg total) into the muscle every 30 (thirty) days. 12 mL 0  . dicyclomine (BENTYL) 20 MG tablet TAKE 1 TABLET BY MOUTH 3 TIMES A DAY BEFORE MEALS 90 tablet 3  . estradiol (VIVELLE-DOT) 0.05 MG/24HR patch Place 1 patch onto the skin 2 (two) times a week.    . lamoTRIgine (LAMICTAL) 100 MG tablet Take 250 mg by mouth daily.    Marland Kitchen. omeprazole (PRILOSEC) 20 MG capsule Take 1 capsule (20 mg total) by mouth daily. 90 capsule 3   No current facility-administered medications for this visit.   Allergies  Allergen Reactions  . Clarithromycin   . Sulfonamide Derivatives      Review of Systems: All systems reviewed and negative except where noted in HPI.   Lab Results  Component Value Date   WBC 4.9 10/03/2010   HGB 13.3 10/03/2010   HCT 38.1 10/03/2010   MCV 90.1 10/03/2010   PLT 320.0 10/03/2010    No results found for: ALT, AST, GGT, ALKPHOS, BILITOT  No results found for: CREATININE, BUN, NA, K, CL, CO2   Physical Exam: BP 98/70 mmHg  Pulse 78  Ht 5' 5.5" (1.664 m)  Wt 137 lb (62.143 kg)  BMI 22.44 kg/m2  LMP  04/14/2011 Constitutional: Pleasant,well-developed, female in no acute distress. HEENT: Normocephalic and atraumatic. Conjunctivae are normal. No scleral icterus. Neck supple.  Cardiovascular: Normal rate, regular rhythm.  Pulmonary/chest: Effort normal and breath sounds normal. No wheezing, rales or rhonchi. Abdominal: Soft, nondistended, nontender. Bowel sounds active throughout. There are no masses palpable. No hepatomegaly. Extremities: no edema Lymphadenopathy: No cervical adenopathy noted. Neurological: Alert and oriented to person place and time. Skin: Skin is warm and dry. No rashes noted. Psychiatric: Normal mood and affect. Behavior is normal.   ASSESSMENT AND PLAN: 50 y/o female with PMH as outlined above, here to address the following issues:  Globus - I discussed differential with  her of possible etiologies. Prior EGD with mild esophagitis and with ongoing reflux symptoms despite low dose protonix, I suspect GERD is the most likely cause of her symptoms. She is adament she wishes to continue Celexa at present dose, unfortunately we cannot use higher doses of omeprazole (it increases Celexa levels). We have tried ordering alternative PPI (protonix) but insurance would not cover it and patient could not afford it. Will try adding zantac  BID at this time. If no benefit, we may consider another trial at switch to another PPI, we can try OTC Prevacid for a few weeks in this setting. She otherwise has no dysphagia and no tobacco use. If symptoms persist despite maximizing reflux therapy, may consider ENT evaluation.   GERD - management as above  IBS-D - worsening symptoms recently. I discussed low FODMOP diet with her and she wishes to try this and see if it helps. She eats out of the home frequency and perhaps related. Otherwise given her bloating will give her a 2 week trial of Rifaximin and see if this helps. She can continue bentyl PRN and follow up as needed.   Ileene Patrick, MD [facility] Pager [phone]

## 2016-04-21 ENCOUNTER — Other Ambulatory Visit: Payer: Self-pay | Admitting: Gastroenterology

## 2016-09-02 ENCOUNTER — Other Ambulatory Visit: Payer: Self-pay | Admitting: Obstetrics & Gynecology

## 2016-09-02 DIAGNOSIS — Z1231 Encounter for screening mammogram for malignant neoplasm of breast: Secondary | ICD-10-CM

## 2016-09-18 ENCOUNTER — Emergency Department (HOSPITAL_COMMUNITY)
Admission: EM | Admit: 2016-09-18 | Discharge: 2016-09-18 | Disposition: A | Discharge status: Home / Self Care | Payer: BC Managed Care – PPO | Attending: Emergency Medicine | Admitting: Emergency Medicine

## 2016-09-18 ENCOUNTER — Encounter (HOSPITAL_COMMUNITY): Payer: Self-pay | Admitting: Emergency Medicine

## 2016-09-18 DIAGNOSIS — Z87891 Personal history of nicotine dependence: Secondary | ICD-10-CM | POA: Diagnosis not present

## 2016-09-18 DIAGNOSIS — R51 Headache: Secondary | ICD-10-CM | POA: Diagnosis present

## 2016-09-18 DIAGNOSIS — G43909 Migraine, unspecified, not intractable, without status migrainosus: Secondary | ICD-10-CM | POA: Diagnosis not present

## 2016-09-18 DIAGNOSIS — Z79899 Other long term (current) drug therapy: Secondary | ICD-10-CM | POA: Insufficient documentation

## 2016-09-18 LAB — I-STAT CHEM 8, ED
BUN: 14 mg/dL (ref 6–20)
CREATININE: 0.8 mg/dL (ref 0.44–1.00)
Calcium, Ion: 1 mmol/L — ABNORMAL LOW (ref 1.15–1.40)
Chloride: 105 mmol/L (ref 101–111)
GLUCOSE: 107 mg/dL — AB (ref 65–99)
HEMATOCRIT: 40 % (ref 36.0–46.0)
Hemoglobin: 13.6 g/dL (ref 12.0–15.0)
Potassium: 3.4 mmol/L — ABNORMAL LOW (ref 3.5–5.1)
Sodium: 140 mmol/L (ref 135–145)
TCO2: 20 mmol/L (ref 0–100)

## 2016-09-18 MED ORDER — PROCHLORPERAZINE EDISYLATE 5 MG/ML IJ SOLN
10.0000 mg | Freq: Once | INTRAMUSCULAR | Status: AC
  Administered 2016-09-18: 10 mg via INTRAVENOUS
  Filled 2016-09-18: qty 2

## 2016-09-18 MED ORDER — KETOROLAC TROMETHAMINE 30 MG/ML IJ SOLN
30.0000 mg | Freq: Once | INTRAMUSCULAR | Status: AC
  Administered 2016-09-18: 30 mg via INTRAVENOUS
  Filled 2016-09-18: qty 1

## 2016-09-18 MED ORDER — BUTALBITAL-APAP-CAFFEINE 50-325-40 MG PO TABS: 1.0000 | ORAL_TABLET | Freq: Four times a day (QID) | ORAL | 0 refills | Status: DC | PRN

## 2016-09-18 MED ORDER — DIPHENHYDRAMINE HCL 50 MG/ML IJ SOLN
25.0000 mg | Freq: Once | INTRAMUSCULAR | Status: AC
  Administered 2016-09-18: 25 mg via INTRAVENOUS
  Filled 2016-09-18: qty 1

## 2016-09-18 MED ORDER — SODIUM CHLORIDE 0.9 % IV BOLUS (SEPSIS)
1000.0000 mL | Freq: Once | INTRAVENOUS | Status: AC
  Administered 2016-09-18: 1000 mL via INTRAVENOUS

## 2016-09-18 MED ORDER — METHYLPREDNISOLONE SODIUM SUCC 125 MG IJ SOLR
125.0000 mg | Freq: Once | INTRAMUSCULAR | Status: AC
  Administered 2016-09-18: 125 mg via INTRAVENOUS
  Filled 2016-09-18: qty 2

## 2016-09-18 MED ORDER — MAGNESIUM SULFATE 2 GM/50ML IV SOLN
2.0000 g | Freq: Once | INTRAVENOUS | Status: AC
Start: 2016-09-18 — End: 2016-09-18
  Administered 2016-09-18: 2 g via INTRAVENOUS
  Filled 2016-09-18: qty 50

## 2016-09-18 MED ORDER — PROCHLORPERAZINE MALEATE 10 MG PO TABS: 10.0000 mg | ORAL_TABLET | Freq: Two times a day (BID) | ORAL | 0 refills | Status: DC | PRN

## 2016-09-18 NOTE — ED Notes (Signed)
PA Pisciotta made aware that patient appears to be in a lot of pain and appears very uncomfortable.

## 2016-09-18 NOTE — ED Triage Notes (Signed)
Pt reports severe headache that began around 0330, pt has hx of migraines. Pt also reports vomiting, denies weakness or blurred vision. Pt a/ox4.

## 2016-09-18 NOTE — ED Provider Notes (Signed)
[facility]EM[facility]Y DEPT Provider Note   CSN: [id number] Arrival date & time: 09/18/16  0546     History   Chief Complaint Chief Complaint  Patient presents with  . Migraine   HPI   Blood pressure 140/86, pulse 96, temperature 97.9 F (36.6 C), temperature source Oral, resp. rate 24, height 5\' 5"  (1.651 m), weight 61.2 kg, last menstrual period 04/14/2011, SpO2 99 %.  Sue Blair is a 51 y.o. female complaining of Headache exacerbation onset at 3:30 AM, it woke her from sleep she states that it's right periorbital radiating across the forehead, 10 out of 10 and throbbing. Associated with multiple episodes of nausea vomiting, photophobia and phonophobia. She states that this is typical for her headaches. She was given a prescription for Vicodin for abortive medication but states that this is not helping. She's never seen a neurologist for this. Pt denies fever, rash, confusion, cervicalgia, LOC/syncope, change in vision, N/V, numbness, weakness, dysarthria, ataxia, thunderclap onset, exacerbation with exertion or valsalva, exacerbation in morning, CP, SOB, abdominal pain.   Past Medical History:  Diagnosis Date  . Anxiety   . Chronic headaches   . Colitis    lymphocytic colitis - resolved  . Gastric polyp   . GERD (gastroesophageal reflux disease) 06/17/2007  . Hiatal hernia 06/17/2007  . History of colon polyps   . IBS (irritable bowel syndrome)   . Osteopenia     Patient Active Problem List   Diagnosis Date Noted  . Paresthesias 01/29/2015  . Constipation 01/04/2012  . Internal hemorrhoids 01/04/2012  . ANEMIA, B12 DEFICIENCY 05/29/2008  . ANXIETY 08/20/2007  . COLITIS 08/20/2007  . IRRITABLE BOWEL SYNDROME 08/20/2007  . HEMOCCULT POSITIVE STOOL 08/20/2007  . OSTEOPENIA 08/20/2007  . ALLERGY 08/20/2007    Past Surgical History:  Procedure Laterality Date  . BREAST ENHANCEMENT SURGERY    . CESAREAN SECTION  1998  . COLONOSCOPY    . LAPAROSCOPIC ASSISTED VAGINAL  HYSTERECTOMY  10-2013  . POLYPECTOMY      OB History    No data available       Home Medications    Prior to Admission medications   Medication Sig Start Date End Date Taking? Authorizing Provider  ALPRAZolam Prudy Feeler(XANAX) 0.5 MG tablet Take 0.5 mg by mouth at bedtime as needed.    Historical Provider, MD  butalbital-acetaminophen-caffeine (FIORICET, ESGIC) 50-325-40 MG tablet Take 1-2 tablets by mouth every 6 (six) hours as needed for headache. Max 6 tabs per day 09/18/16 09/18/17  Joni ReiningNicole Graycie Halley, PA-C  citalopram (CELEXA) 40 MG tablet  08/27/13   Historical Provider, MD  cyanocobalamin (,VITAMIN B-12,) 1000 MCG/ML injection Inject 1 mL (1,000 mcg total) into the muscle every 30 (thirty) days. 03/30/15   Ruffin FrederickSteven Paul Armbruster, MD  dicyclomine (BENTYL) 20 MG tablet TAKE 1 TABLET BY MOUTH 3 TIMES A DAY BEFORE MEALS 11/20/15   Ruffin FrederickSteven Paul Armbruster, MD  estradiol (VIVELLE-DOT) 0.05 MG/24HR patch Place 1 patch onto the skin 2 (two) times a week.    Historical Provider, MD  lamoTRIgine (LAMICTAL) 100 MG tablet Take 250 mg by mouth daily.    Historical Provider, MD  omeprazole (PRILOSEC) 20 MG capsule TAKE 1 CAPSULE EVERY DAY 04/21/16   Ruffin FrederickSteven Paul Armbruster, MD  prochlorperazine (COMPAZINE) 10 MG tablet Take 1 tablet (10 mg total) by mouth 2 (two) times daily as needed (Headache/ Nausea). 09/18/16   Maanasa Aderhold, PA-C  ranitidine (ZANTAC) 150 MG tablet Take 1 tablet (150 mg total) by mouth 2 (two) times daily.  12/27/15   Ruffin Frederick, MD  rifaximin (XIFAXAN) 550 MG TABS tablet Take 1 tablet (550 mg total) by mouth 3 (three) times daily. 12/27/15   Ruffin Frederick, MD    Family History Family History  Problem Relation Age of Onset  . Breast cancer Mother   . Breast cancer Maternal Aunt   . Breast cancer Maternal Grandmother   . Colon cancer Maternal Uncle   . Colon cancer Paternal Uncle   . Crohn's disease Paternal Uncle   . Diabetes Father   . Esophageal cancer Neg Hx   .  Rectal cancer Neg Hx   . Stomach cancer Neg Hx     Social History Social History  Substance Use Topics  . Smoking status: Former Games developer  . Smokeless tobacco: Never Used  . Alcohol use No     Allergies   Clarithromycin and Sulfonamide derivatives   Review of Systems Review of Systems  10 systems reviewed and found to be negative, except as noted in the HPI.   Physical Exam Updated Vital Signs BP 124/73 (BP Location: Right Arm)   Pulse 86   Temp 97.9 F (36.6 C) (Oral)   Resp 20   Ht 5\' 5"  (1.651 m)   Wt 61.2 kg   LMP 04/14/2011   SpO2 97%   BMI 22.47 kg/m   Physical Exam  Constitutional: She is oriented to person, place, and time. She appears well-developed and well-nourished.  Appears acutely uncomfortable  HENT:  Head: Normocephalic and atraumatic.  Mouth/Throat: Oropharynx is clear and moist.  Eyes: Conjunctivae and EOM are normal. Pupils are equal, round, and reactive to light.  No TTP of maxillary or frontal sinuses  No TTP or induration of temporal arteries bilaterally  Neck: Normal range of motion. Neck supple.  FROM to C-spine. Pt can touch chin to chest without discomfort. No TTP of midline cervical spine.   Cardiovascular: Normal rate, regular rhythm and intact distal pulses.   Pulmonary/Chest: Effort normal and breath sounds normal. No respiratory distress. She has no wheezes. She has no rales. She exhibits no tenderness.  Abdominal: Soft. Bowel sounds are normal. There is no tenderness.  Musculoskeletal: Normal range of motion. She exhibits no edema or tenderness.  Neurological: She is alert and oriented to person, place, and time. No cranial nerve deficit.  II-Visual fields grossly intact. III/IV/VI-Extraocular movements intact.  Pupils reactive bilaterally. V/VII-Smile symmetric, equal eyebrow raise,  facial sensation intact VIII- Hearing grossly intact IX/X-Normal gag XI-bilateral shoulder shrug XII-midline tongue extension Motor: 5/5  bilaterally with normal tone and bulk Cerebellar: Normal finger-to-nose  and normal heel-to-shin test.   Romberg negative Ambulates with a coordinated gait   Nursing note and vitals reviewed.    ED Treatments / Results  Labs (all labs ordered are listed, but only abnormal results are displayed) Labs Reviewed  I-STAT CHEM 8, ED - Abnormal; Notable for the following:       Result Value   Potassium 3.4 (*)    Glucose, Bld 107 (*)    Calcium, Ion 1.00 (*)    All other components within normal limits    EKG  EKG Interpretation None       Radiology No results found.  Procedures Procedures (including critical care time)  Medications Ordered in ED Medications  sodium chloride 0.9 % bolus 1,000 mL (0 mLs Intravenous Stopped 09/18/16 0852)  prochlorperazine (COMPAZINE) injection 10 mg (10 mg Intravenous Given 09/18/16 0703)  methylPREDNISolone sodium succinate (SOLU-MEDROL) 125 mg/2 mL injection  125 mg (125 mg Intravenous Given 09/18/16 0704)  magnesium sulfate IVPB 2 g 50 mL (0 g Intravenous Stopped 09/18/16 0813)  ketorolac (TORADOL) 30 MG/ML injection 30 mg (30 mg Intravenous Given 09/18/16 0703)  diphenhydrAMINE (BENADRYL) injection 25 mg (25 mg Intravenous Given 09/18/16 0704)     Initial Impression / Assessment and Plan / ED Course  I have reviewed the triage vital signs and the nursing notes.  Pertinent labs & imaging results that were available during my care of the patient were reviewed by me and considered in my medical decision making (see chart for details).     Vitals:   09/18/16 0600 09/18/16 0601 09/18/16 0714 09/18/16 0800  BP:   145/90 124/73  Pulse: 91  85 86  Resp:    20  Temp:      TempSrc:      SpO2: 100%  98% 97%  Weight:  61.2 kg    Height:  5\' 5"  (1.651 m)      Medications  sodium chloride 0.9 % bolus 1,000 mL (0 mLs Intravenous Stopped 09/18/16 0852)  prochlorperazine (COMPAZINE) injection 10 mg (10 mg Intravenous Given 09/18/16 0703)    methylPREDNISolone sodium succinate (SOLU-MEDROL) 125 mg/2 mL injection 125 mg (125 mg Intravenous Given 09/18/16 0704)  magnesium sulfate IVPB 2 g 50 mL (0 g Intravenous Stopped 09/18/16 0813)  ketorolac (TORADOL) 30 MG/ML injection 30 mg (30 mg Intravenous Given 09/18/16 0703)  diphenhydrAMINE (BENADRYL) injection 25 mg (25 mg Intravenous Given 09/18/16 0704)    RONNITA PAZ is 51 y.o. female presenting with chronic migraine exacerbation. States typical for prior migraines. HA. Presentation is like pts typical HA and non concerning for [facility], ICH, Meningitis, or temporal arteritis. Pt is afebrile with no focal neuro deficits, nuchal rigidity, or change in vision. Pt is to follow up with Neuro to discuss prophylactic medication. Pt verbalizes understanding and is agreeable with plan to dc.  Evaluation does not show pathology that would require ongoing emergent intervention or inpatient treatment. Pt is hemodynamically stable and mentating appropriately. Discussed findings and plan with patient/guardian, who agrees with care plan. All questions answered. Return precautions discussed and outpatient follow up given.   Final Clinical Impressions(s) / ED Diagnoses   Final diagnoses:  Migraine without status migrainosus, not intractable, unspecified migraine type    New Prescriptions New Prescriptions   BUTALBITAL-ACETAMINOPHEN-CAFFEINE (FIORICET, ESGIC) 50-325-40 MG TABLET    Take 1-2 tablets by mouth every 6 (six) hours as needed for headache. Max 6 tabs per day   PROCHLORPERAZINE (COMPAZINE) 10 MG TABLET    Take 1 tablet (10 mg total) by mouth 2 (two) times daily as needed (Headache/ Nausea).     Joni Reining Freeman Borba, PA-C 09/18/16 6045    Vanetta Mulders, MD 09/19/16 [phone]

## 2016-09-18 NOTE — Discharge Instructions (Addendum)
Please follow with your primary care doctor in the next 2 days for a check-up. They must obtain records for further management.  ° °Do not hesitate to return to the Emergency Department for any new, worsening or concerning symptoms.  ° °

## 2016-10-01 ENCOUNTER — Ambulatory Visit
Admission: RE | Admit: 2016-10-01 | Discharge: 2016-10-01 | Disposition: A | Discharge status: Home / Self Care | Payer: BC Managed Care – PPO | Source: Ambulatory Visit | Attending: Obstetrics & Gynecology | Admitting: Obstetrics & Gynecology

## 2016-10-01 DIAGNOSIS — Z1231 Encounter for screening mammogram for malignant neoplasm of breast: Secondary | ICD-10-CM

## 2016-12-15 ENCOUNTER — Other Ambulatory Visit: Payer: Self-pay | Admitting: Gastroenterology

## 2017-05-07 ENCOUNTER — Encounter (HOSPITAL_COMMUNITY): Payer: Self-pay

## 2017-05-07 ENCOUNTER — Emergency Department (HOSPITAL_COMMUNITY)
Admission: EM | Admit: 2017-05-07 | Discharge: 2017-05-07 | Disposition: A | Discharge status: Home / Self Care | Payer: BC Managed Care – PPO | Attending: Emergency Medicine | Admitting: Emergency Medicine

## 2017-05-07 DIAGNOSIS — R519 Headache, unspecified: Secondary | ICD-10-CM

## 2017-05-07 DIAGNOSIS — R51 Headache: Secondary | ICD-10-CM | POA: Diagnosis not present

## 2017-05-07 DIAGNOSIS — R112 Nausea with vomiting, unspecified: Secondary | ICD-10-CM | POA: Diagnosis not present

## 2017-05-07 DIAGNOSIS — F17200 Nicotine dependence, unspecified, uncomplicated: Secondary | ICD-10-CM | POA: Insufficient documentation

## 2017-05-07 DIAGNOSIS — Z79899 Other long term (current) drug therapy: Secondary | ICD-10-CM | POA: Insufficient documentation

## 2017-05-07 DIAGNOSIS — G43909 Migraine, unspecified, not intractable, without status migrainosus: Secondary | ICD-10-CM | POA: Diagnosis present

## 2017-05-07 HISTORY — DX: Migraine, unspecified, not intractable, without status migrainosus: G43.909

## 2017-05-07 MED ORDER — BUTALBITAL-APAP-CAFFEINE 50-325-40 MG PO TABS: 1.0000 | ORAL_TABLET | Freq: Four times a day (QID) | ORAL | 0 refills | Status: AC | PRN

## 2017-05-07 MED ORDER — ONDANSETRON HCL 4 MG PO TABS: 4.0000 mg | ORAL_TABLET | Freq: Three times a day (TID) | ORAL | 0 refills | Status: DC | PRN

## 2017-05-07 MED ORDER — DIPHENHYDRAMINE HCL 50 MG/ML IJ SOLN
25.0000 mg | Freq: Once | INTRAMUSCULAR | Status: AC
Start: 2017-05-07 — End: 2017-05-07
  Administered 2017-05-07: 25 mg via INTRAMUSCULAR
  Filled 2017-05-07: qty 1

## 2017-05-07 MED ORDER — DEXAMETHASONE SODIUM PHOSPHATE 10 MG/ML IJ SOLN
10.0000 mg | Freq: Once | INTRAMUSCULAR | Status: AC
  Administered 2017-05-07: 10 mg via INTRAMUSCULAR
  Filled 2017-05-07: qty 1

## 2017-05-07 MED ORDER — KETOROLAC TROMETHAMINE 60 MG/2ML IM SOLN
60.0000 mg | Freq: Once | INTRAMUSCULAR | Status: AC
  Administered 2017-05-07: 60 mg via INTRAMUSCULAR
  Filled 2017-05-07: qty 2

## 2017-05-07 MED ORDER — KETOROLAC TROMETHAMINE 30 MG/ML IJ SOLN
30.0000 mg | Freq: Once | INTRAMUSCULAR | Status: AC
  Administered 2017-05-07: 30 mg via INTRAVENOUS
  Filled 2017-05-07: qty 1

## 2017-05-07 MED ORDER — METOCLOPRAMIDE HCL 5 MG/ML IJ SOLN
10.0000 mg | Freq: Once | INTRAMUSCULAR | Status: AC
  Administered 2017-05-07: 10 mg via INTRAVENOUS
  Filled 2017-05-07: qty 2

## 2017-05-07 MED ORDER — DIPHENHYDRAMINE HCL 50 MG/ML IJ SOLN
25.0000 mg | Freq: Once | INTRAMUSCULAR | Status: AC
  Administered 2017-05-07: 25 mg via INTRAVENOUS
  Filled 2017-05-07: qty 1

## 2017-05-07 MED ORDER — SODIUM CHLORIDE 0.9 % IV BOLUS (SEPSIS)
1000.0000 mL | Freq: Once | INTRAVENOUS | Status: AC
  Administered 2017-05-07: 1000 mL via INTRAVENOUS

## 2017-05-07 MED ORDER — METOCLOPRAMIDE HCL 5 MG/ML IJ SOLN
10.0000 mg | Freq: Once | INTRAMUSCULAR | Status: AC
  Administered 2017-05-07: 10 mg via INTRAMUSCULAR
  Filled 2017-05-07: qty 2

## 2017-05-07 NOTE — ED Notes (Signed)
Patient assisted to restroom.  

## 2017-05-07 NOTE — ED Notes (Signed)
Patient reports feeling anxious, fidgeting and pacing around room in triage. MD aware, see new order.

## 2017-05-07 NOTE — ED Triage Notes (Signed)
Per Pt, Pt is coming from home with complaints of migraine that started this morning at 0630. Pt reports seeing her doctor in the past and being prescribed: Compazine, Zofran, Fioricet, and Benadryl. Pt took medications but vomited shortly afterward. Pt denies blurred vision or double vision, unilateral weakness or any other neuro symptoms.

## 2017-05-07 NOTE — ED Provider Notes (Signed)
[facility] Provider Note   CSN: [id number] Arrival date & time: 05/07/17  1003     History   Chief Complaint Chief Complaint  Patient presents with  . Migraine    HPI Sue Blair is a 51 y.o. female.  HPI   51 year old female with history of migraine, chronic headache, anxiety presenting today for evaluation of headache. Patient reports gradual onset of right-sided headache that started earlier this morning has been persistent since. She endorse seen or private headache, also endorsed having persistent nausea, vomited multiple times of nonbloody nonbilious vomitus. Headache is moderate to severe, similar to prior headache but more intense. She was recently well last night. She does have one similar episode of headache in April when she was treated in the ER. She is unable to pinpoint what triggers a headache. She has not follow up with neurologist for this. She denies any associated fever, chills, opiate, loss of vision, recent URI symptoms, neck stiffness, chest pain, shortness of breath, abdominal pain, dysuria, or rash.  Past Medical History:  Diagnosis Date  . Anxiety   . Chronic headaches   . Colitis    lymphocytic colitis - resolved  . Gastric polyp   . GERD (gastroesophageal reflux disease) 06/17/2007  . Hiatal hernia 06/17/2007  . History of colon polyps   . IBS (irritable bowel syndrome)   . Migraines   . Osteopenia     Patient Active Problem List   Diagnosis Date Noted  . Paresthesias 01/29/2015  . Constipation 01/04/2012  . Internal hemorrhoids 01/04/2012  . ANEMIA, B12 DEFICIENCY 05/29/2008  . ANXIETY 08/20/2007  . COLITIS 08/20/2007  . IRRITABLE BOWEL SYNDROME 08/20/2007  . HEMOCCULT POSITIVE STOOL 08/20/2007  . OSTEOPENIA 08/20/2007  . ALLERGY 08/20/2007    Past Surgical History:  Procedure Laterality Date  . BREAST ENHANCEMENT SURGERY    . CESAREAN SECTION  1998  . COLONOSCOPY    . LAPAROSCOPIC ASSISTED VAGINAL HYSTERECTOMY  10-2013  .  POLYPECTOMY      OB History    No data available       Home Medications    Prior to Admission medications   Medication Sig Start Date End Date Taking? Authorizing Provider  ALPRAZolam Prudy Feeler) 0.5 MG tablet Take 0.5 mg by mouth at bedtime as needed.    [provider]  butalbital-acetaminophen-caffeine (FIORICET, ESGIC) 50-325-40 MG tablet Take 1-2 tablets by mouth every 6 (six) hours as needed for headache. Max 6 tabs per day 09/18/16 09/18/17  Pisciotta, Joni Reining, PA-C  citalopram (CELEXA) 40 MG tablet  08/27/13   [provider]  cyanocobalamin (,VITAMIN B-12,) 1000 MCG/ML injection Inject 1 mL (1,000 mcg total) into the muscle every 30 (thirty) days. 03/30/15   Armbruster, Willaim Rayas, MD  dicyclomine (BENTYL) 20 MG tablet TAKE 1 TABLET BY MOUTH 3 TIMES A DAY BEFORE MEALS 12/15/16   Armbruster, Willaim Rayas, MD  estradiol (VIVELLE-DOT) 0.05 MG/24HR patch Place 1 patch onto the skin 2 (two) times a week.    [provider]  lamoTRIgine (LAMICTAL) 100 MG tablet Take 250 mg by mouth daily.    [provider]  omeprazole (PRILOSEC) 20 MG capsule TAKE 1 CAPSULE EVERY DAY 04/21/16   Armbruster, Willaim Rayas, MD  prochlorperazine (COMPAZINE) 10 MG tablet Take 1 tablet (10 mg total) by mouth 2 (two) times daily as needed (Headache/ Nausea). 09/18/16   Pisciotta, Joni Reining, PA-C  ranitidine (ZANTAC) 150 MG tablet Take 1 tablet (150 mg total) by mouth 2 (two) times daily.  12/27/15   Armbruster, Willaim Rayas, MD  rifaximin (XIFAXAN) 550 MG TABS tablet Take 1 tablet (550 mg total) by mouth 3 (three) times daily. 12/27/15   Armbruster, Willaim Rayas, MD    Family History Family History  Problem Relation Age of Onset  . Breast cancer Mother   . Breast cancer Maternal Aunt   . Breast cancer Maternal Grandmother   . Colon cancer Maternal Uncle   . Colon cancer Paternal Uncle   . Crohn's disease Paternal Uncle   . Diabetes Father   . Esophageal cancer Neg Hx   . Rectal cancer Neg Hx   .  Stomach cancer Neg Hx     Social History Social History  Substance Use Topics  . Smoking status: Former Games developer  . Smokeless tobacco: Never Used  . Alcohol use No     Allergies   Clarithromycin and Sulfonamide derivatives   Review of Systems Review of Systems  All other systems reviewed and are negative.    Physical Exam Updated Vital Signs BP (!) 144/87 (BP Location: Left Arm)   Pulse 97   Temp (!) 97.4 F (36.3 C) (Oral)   Resp 16   Ht 5' 5.5" (1.664 m)   Wt 61.7 kg (136 lb)   LMP 04/14/2011   SpO2 99%   BMI 22.29 kg/m   Physical Exam  Constitutional: She is oriented to person, place, and time. She appears well-developed and well-nourished. No distress.  HENT:  Head: Atraumatic.  Right Ear: External ear normal.  Left Ear: External ear normal.  Mouth/Throat: Oropharynx is clear and moist.  Eyes: Pupils are equal, round, and reactive to light. Conjunctivae and EOM are normal.  Neck: Normal range of motion. Neck supple.  No nuchal rigidity  Cardiovascular: Normal rate and regular rhythm.   Pulmonary/Chest: Effort normal and breath sounds normal.  Abdominal: Soft. She exhibits no distension. There is no tenderness.  Neurological: She is alert and oriented to person, place, and time. She has normal strength. No cranial nerve deficit or sensory deficit. GCS eye subscore is 4. GCS verbal subscore is 5. GCS motor subscore is 6.  Skin: No rash noted.  Psychiatric: She has a normal mood and affect.  Nursing note and vitals reviewed.    ED Treatments / Results  Labs (all labs ordered are listed, but only abnormal results are displayed) Labs Reviewed - No data to display  EKG  EKG Interpretation None       Radiology No results found.  Procedures Procedures (including critical care time)  Medications Ordered in ED Medications  dexamethasone (DECADRON) injection 10 mg (10 mg Intramuscular Given 05/07/17 1029)  metoCLOPramide (REGLAN) injection 10 mg (10  mg Intramuscular Given 05/07/17 1026)  ketorolac (TORADOL) injection 60 mg (60 mg Intramuscular Given 05/07/17 1029)  diphenhydrAMINE (BENADRYL) injection 25 mg (25 mg Intramuscular Given 05/07/17 1123)     Initial Impression / Assessment and Plan / ED Course  I have reviewed the triage vital signs and the nursing notes.  Pertinent labs & imaging results that were available during my care of the patient were reviewed by me and considered in my medical decision making (see chart for details).     BP 135/79   Pulse 90   Temp (!) 97.4 F (36.3 C) (Oral)   Resp 16   Ht 5' 5.5" (1.664 m)   Wt 61.7 kg (136 lb)   LMP 04/14/2011   SpO2 99%   BMI 22.29 kg/m    Final Clinical  Impressions(s) / ED Diagnoses   Final diagnoses:  Bad headache    New Prescriptions New Prescriptions   ONDANSETRON (ZOFRAN) 4 MG TABLET    Take 1 tablet (4 mg total) by mouth every 8 (eight) hours as needed for nausea or vomiting.   Headache similar to previous, no fever, neck stiffness, neuro findings or new symptoms to suggest more serious etiology.  I don't think SAH, ICH, meningitis, encephalitis, mass at this time.  No recent trauma.  I don't feel imaging necessary at this time.  Plan to control symptoms.  3:20 PM Patient reported improvement of the symptoms after receiving a migraine cocktail. She feels comfortable going home. She will follow-up with neurology for further evaluation of her condition. Return precaution discussed.   Fayrene Helper, PA-C 05/07/17 1521    Cathren Laine, MD 05/08/17 1520

## 2017-05-15 NOTE — Progress Notes (Signed)
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   History of Present Illness: Sue Blair is a 51 y.o. female seen in consultation per request of Dr. Lynann Bologna. According to patient her symptoms a started about 15-20 years ago with increased fatigue. She stays at the time Dr. Virgina Jock did some lab work and was suspicious of autoimmune disease. She recalls seeing me 15 years ago. At that time we decided not to start her on any medications. She states her symptoms persist off and on. Recently she had issues with right elbow pain and bilateral trochanteric bursa pain. She states she went to see Dr. Lynann Bologna who evaluated her and diagnosed her with the right lateral epicondylitis. She was given a cortisone injection and was referred to physical therapy. She's been also having symptoms of bilateral trochanteric bursitis and Dr. Lynann Bologna gave her right trochanteric bursa injection. Patient denies any SI joint pain. She states she also had some recent labs which were positive for ANA . Her mother has lupus.  Activities of Daily Living:  Patient reports morning stiffness for 3 minutes.   Patient Denies nocturnal pain.  Difficulty dressing/grooming: Denies Difficulty climbing stairs: Reports Difficulty getting out of chair: Reports Difficulty using hands for taps, buttons, cutlery, and/or writing: Denies   Review of Systems  Constitutional: Positive for fatigue. Negative for night sweats, weight gain, weight loss and weakness.  HENT: Positive for mouth dryness. Negative for mouth sores, trouble swallowing, trouble swallowing and nose dryness.   Eyes: Positive for dryness. Negative for pain, redness and visual disturbance.  Respiratory: Negative for cough, shortness of breath and  difficulty breathing.   Cardiovascular: Negative for chest pain, palpitations, hypertension, irregular heartbeat and swelling in legs/feet.  Gastrointestinal: Positive for constipation, diarrhea and heartburn. Negative for blood in stool.       History of IBS  Endocrine: Positive for excessive thirst. Negative for increased urination.  Genitourinary: Negative for difficulty urinating, painful urination and vaginal dryness.  Musculoskeletal: Positive for arthralgias, joint pain, morning stiffness and muscle tenderness. Negative for joint swelling, myalgias, muscle weakness and myalgias.  Skin: Negative for color change, rash, hair loss, skin tightness, ulcers and sensitivity to sunlight.  Allergic/Immunologic: Negative for susceptible to infections.  Neurological: Positive for headaches and memory loss. Negative for dizziness and night sweats.  Hematological: Negative for bruising/bleeding tendency and swollen glands.  Psychiatric/Behavioral: Negative for depressed mood and sleep disturbance. The patient is nervous/anxious.     PMFS History:  Patient Active Problem List   Diagnosis Date Noted  . Paresthesias 01/29/2015  . Constipation 01/04/2012  . Internal hemorrhoids 01/04/2012  . ANEMIA, B12 DEFICIENCY 05/29/2008  . ANXIETY 08/20/2007  . COLITIS 08/20/2007  . IRRITABLE BOWEL SYNDROME 08/20/2007  . HEMOCCULT POSITIVE STOOL 08/20/2007  . OSTEOPENIA 08/20/2007  . ALLERGY 08/20/2007    Past Medical History:  Diagnosis Date  . Anxiety   . Chronic headaches   . Colitis    lymphocytic colitis - resolved  . Gastric polyp   . GERD (gastroesophageal reflux disease) 06/17/2007  . Hiatal hernia 06/17/2007  . History of colon polyps   .  IBS (irritable bowel syndrome)   . Migraines   . Osteopenia   . Tennis elbow     Family History  Problem Relation Age of Onset  . Breast cancer Mother   . Breast cancer Maternal Aunt   . Breast cancer Maternal Grandmother   . Colon cancer Maternal  Uncle   . Colon cancer Paternal Uncle   . Crohn's disease Paternal Uncle   . Diabetes Father   . Esophageal cancer Neg Hx   . Rectal cancer Neg Hx   . Stomach cancer Neg Hx    Past Surgical History:  Procedure Laterality Date  . BREAST ENHANCEMENT SURGERY    . CESAREAN SECTION  1998  . COLONOSCOPY    . LAPAROSCOPIC ASSISTED VAGINAL HYSTERECTOMY  10-2013  . POLYPECTOMY     Social History   Social History Narrative  . No narrative on file     Objective: Vital Signs: BP 115/75 (BP Location: Left Arm, Patient Position: Sitting, Cuff Size: Normal)   Pulse 81   Resp 14   Ht 5' 5.5" (1.664 m)   Wt 140 lb (63.5 kg)   LMP 04/14/2011   BMI 22.94 kg/m    Physical Exam  Constitutional: She is oriented to person, place, and time. She appears well-developed and well-nourished.  HENT:  Head: Normocephalic and atraumatic.  Eyes: Conjunctivae and EOM are normal.  Neck: Normal range of motion.  Cardiovascular: Normal rate, regular rhythm, normal heart sounds and intact distal pulses.   Pulmonary/Chest: Effort normal and breath sounds normal.  Abdominal: Soft. Bowel sounds are normal.  Lymphadenopathy:    She has no cervical adenopathy.  Neurological: She is alert and oriented to person, place, and time.  Skin: Skin is warm and dry. Capillary refill takes less than 2 seconds.  Psychiatric: She has a normal mood and affect. Her behavior is normal.  Nursing note and vitals reviewed.    Musculoskeletal Exam: C-spine and thoracic lumbar spine good range of motion. Shoulder joints elbow joints wrist joints are good range of motion. Hip joints knee joints ankles MTPs PIPs DIPs with good range of motion. She had some costochondral tenderness. She tenderness over bilateral lateral epicondyle. Tenderness over bilateral trochanteric bursa and SI joints.  CDAI Exam: No CDAI exam completed.    Investigation: No additional findings. 11/13/2016 ESR 4, RF negative, ANA +1:160NO, HLA-B27  negative  Imaging: Xr Pelvis 1-2 Views  Result Date: 05/22/2017 No SI joint sclerosis are narrowing was noted. Osteoarthritic changes were noted.   Speciality Comments: No specialty comments available.    Procedures:  No procedures performed Allergies: Clarithromycin and Sulfonamide derivatives   Assessment / Plan:     Visit Diagnoses: Positive ANA (antinuclear antibody): Patient gives history of mild sicca symptoms. She also has positive history of lupus in her mother. She has no other clinical features of autoimmune disease. I will obtain following labs to evaluate this further.  Chronic SI joint pain - Dr. Lynann Bologna is concerned about possible bilateral sacroiliitis. I'll obtain Glo Herring view of the pelvis today which showed only osteoarthritic changes in the SI joints. No sclerosis are narrowing was noted. Findings were discussed with patient.  Bilateral lateral epicondylitis: She is using a brace on her right elbow. She is going to physical therapy. She also had cortisone injection by Dr. Lynann Bologna.  Bilateral trochanteric bursitis: She had cortisone injection to the right trochanteric area. She continues to have some discomfort in the IT band area.  History of migraine: She continues to  have chronic migraines.  History of anxiety: Her anxiety persist despite taking medications.  ANEMIA, B12 DEFICIENCY  Internal hemorrhoids  Microscopic hematuria  Celiac sprue  Former smoker  Family history of systemic lupus erythematosus (SLE) in mother    Orders: Orders Placed This Encounter  Procedures  . XR Pelvis 1-2 Views  . C3 and C4  . Sjogrens syndrome-B extractable nuclear antibody  . Sjogrens syndrome-A extractable nuclear antibody  . Anti-Smith antibody  . RNP Antibody  . Anti-scleroderma antibody  . Anti-DNA antibody, double-stranded   No orders of the defined types were placed in this encounter.   Face-to-face time spent with patient was 45 minutes. 50% of time  was spent in counseling and coordination of care.  Follow-Up Instructions: Return for +ANA.   Bo Merino, MD  Note - This record has been created using Editor, commissioning.  Chart creation errors have been sought, but may not always  have been located. Such creation errors do not reflect on  the standard of medical care.

## 2017-05-22 ENCOUNTER — Encounter: Payer: Self-pay | Admitting: Rheumatology

## 2017-05-22 ENCOUNTER — Ambulatory Visit (INDEPENDENT_AMBULATORY_CARE_PROVIDER_SITE_OTHER): Payer: BC Managed Care – PPO

## 2017-05-22 ENCOUNTER — Ambulatory Visit (INDEPENDENT_AMBULATORY_CARE_PROVIDER_SITE_OTHER): Payer: BC Managed Care – PPO | Admitting: Rheumatology

## 2017-05-22 VITALS — BP 115/75 | HR 81 | Resp 14 | Ht 65.5 in | Wt 140.0 lb

## 2017-05-22 DIAGNOSIS — G8929 Other chronic pain: Secondary | ICD-10-CM

## 2017-05-22 DIAGNOSIS — D518 Other vitamin B12 deficiency anemias: Secondary | ICD-10-CM

## 2017-05-22 DIAGNOSIS — K648 Other hemorrhoids: Secondary | ICD-10-CM

## 2017-05-22 DIAGNOSIS — K900 Celiac disease: Secondary | ICD-10-CM

## 2017-05-22 DIAGNOSIS — Z8669 Personal history of other diseases of the nervous system and sense organs: Secondary | ICD-10-CM

## 2017-05-22 DIAGNOSIS — M7061 Trochanteric bursitis, right hip: Secondary | ICD-10-CM

## 2017-05-22 DIAGNOSIS — Z8269 Family history of other diseases of the musculoskeletal system and connective tissue: Secondary | ICD-10-CM

## 2017-05-22 DIAGNOSIS — M7711 Lateral epicondylitis, right elbow: Secondary | ICD-10-CM

## 2017-05-22 DIAGNOSIS — Z87891 Personal history of nicotine dependence: Secondary | ICD-10-CM

## 2017-05-22 DIAGNOSIS — R3129 Other microscopic hematuria: Secondary | ICD-10-CM | POA: Diagnosis not present

## 2017-05-22 DIAGNOSIS — Z8659 Personal history of other mental and behavioral disorders: Secondary | ICD-10-CM | POA: Diagnosis not present

## 2017-05-22 DIAGNOSIS — R768 Other specified abnormal immunological findings in serum: Secondary | ICD-10-CM | POA: Diagnosis not present

## 2017-05-22 DIAGNOSIS — M533 Sacrococcygeal disorders, not elsewhere classified: Secondary | ICD-10-CM

## 2017-05-22 DIAGNOSIS — M7712 Lateral epicondylitis, left elbow: Secondary | ICD-10-CM

## 2017-05-22 DIAGNOSIS — Z832 Family history of diseases of the blood and blood-forming organs and certain disorders involving the immune mechanism: Secondary | ICD-10-CM

## 2017-05-22 DIAGNOSIS — M7062 Trochanteric bursitis, left hip: Secondary | ICD-10-CM

## 2017-05-25 LAB — RNP ANTIBODY: Ribonucleic Protein(ENA) Antibody, IgG: <1 AI

## 2017-05-25 LAB — SJOGRENS SYNDROME-A EXTRACTABLE NUCLEAR ANTIBODY: SSA (Ro) (ENA) Antibody, IgG: 7.5 AI — AB

## 2017-05-25 LAB — C3 AND C4
C3 COMPLEMENT: 121 mg/dL (ref 83–193)
C4 COMPLEMENT: 27 mg/dL (ref 15–57)

## 2017-05-25 LAB — SJOGRENS SYNDROME-B EXTRACTABLE NUCLEAR ANTIBODY: SSB (LA) (ENA) ANTIBODY, IGG: NEGATIVE AI

## 2017-05-25 LAB — ANTI-SCLERODERMA ANTIBODY: Scleroderma (Scl-70) (ENA) Antibody, IgG: <1 AI

## 2017-05-25 LAB — ANTI-DNA ANTIBODY, DOUBLE-STRANDED: ds DNA Ab: 1 IU/mL

## 2017-05-25 LAB — ANTI-SMITH ANTIBODY: ENA SM AB SER-ACNC: NEGATIVE AI

## 2017-05-25 NOTE — Progress Notes (Signed)
Labs are consistent with Sjogren's. I will discuss at follow-up visit.

## 2017-07-06 ENCOUNTER — Ambulatory Visit: Payer: BC Managed Care – PPO | Admitting: Rheumatology

## 2017-08-19 ENCOUNTER — Other Ambulatory Visit: Payer: Self-pay | Admitting: Obstetrics & Gynecology

## 2017-08-19 DIAGNOSIS — Z1231 Encounter for screening mammogram for malignant neoplasm of breast: Secondary | ICD-10-CM

## 2017-10-02 ENCOUNTER — Ambulatory Visit
Admission: RE | Admit: 2017-10-02 | Discharge: 2017-10-02 | Disposition: A | Discharge status: Home / Self Care | Payer: BC Managed Care – PPO | Source: Ambulatory Visit | Attending: Obstetrics & Gynecology | Admitting: Obstetrics & Gynecology

## 2017-10-02 DIAGNOSIS — Z1231 Encounter for screening mammogram for malignant neoplasm of breast: Secondary | ICD-10-CM

## 2018-09-16 ENCOUNTER — Other Ambulatory Visit: Payer: Self-pay | Admitting: Obstetrics & Gynecology

## 2018-09-16 DIAGNOSIS — T8543XA Leakage of breast prosthesis and implant, initial encounter: Secondary | ICD-10-CM

## 2018-10-04 ENCOUNTER — Ambulatory Visit
Admission: RE | Admit: 2018-10-04 | Discharge: 2018-10-04 | Disposition: A | Discharge status: Home / Self Care | Payer: BC Managed Care – PPO | Source: Ambulatory Visit | Attending: Obstetrics & Gynecology | Admitting: Obstetrics & Gynecology

## 2018-10-04 DIAGNOSIS — T8543XA Leakage of breast prosthesis and implant, initial encounter: Secondary | ICD-10-CM

## 2019-03-04 ENCOUNTER — Emergency Department (HOSPITAL_COMMUNITY): Payer: BC Managed Care – PPO

## 2019-03-04 ENCOUNTER — Emergency Department (HOSPITAL_COMMUNITY)
Admission: EM | Admit: 2019-03-04 | Discharge: 2019-03-04 | Disposition: A | Discharge status: Home / Self Care | Payer: BC Managed Care – PPO | Attending: Emergency Medicine | Admitting: Emergency Medicine

## 2019-03-04 ENCOUNTER — Encounter (HOSPITAL_COMMUNITY): Payer: Self-pay | Admitting: Emergency Medicine

## 2019-03-04 DIAGNOSIS — R531 Weakness: Secondary | ICD-10-CM | POA: Insufficient documentation

## 2019-03-04 DIAGNOSIS — R202 Paresthesia of skin: Secondary | ICD-10-CM | POA: Insufficient documentation

## 2019-03-04 DIAGNOSIS — Z87891 Personal history of nicotine dependence: Secondary | ICD-10-CM | POA: Insufficient documentation

## 2019-03-04 DIAGNOSIS — Z79899 Other long term (current) drug therapy: Secondary | ICD-10-CM | POA: Diagnosis not present

## 2019-03-04 LAB — CBC
HCT: 41.9 % (ref 36.0–46.0)
Hemoglobin: 13.9 g/dL (ref 12.0–15.0)
MCH: 30.3 pg (ref 26.0–34.0)
MCHC: 33.2 g/dL (ref 30.0–36.0)
MCV: 91.5 fL (ref 80.0–100.0)
Platelets: 251 10*3/uL (ref 150–400)
RBC: 4.58 MIL/uL (ref 3.87–5.11)
RDW: 12.7 % (ref 11.5–15.5)
WBC: 3.3 10*3/uL — ABNORMAL LOW (ref 4.0–10.5)
nRBC: 0 % (ref 0.0–0.2)

## 2019-03-04 LAB — CBG MONITORING, ED: Glucose-Capillary: 83 mg/dL (ref 70–99)

## 2019-03-04 LAB — BASIC METABOLIC PANEL
Anion gap: 7 (ref 5–15)
BUN: 8 mg/dL (ref 6–20)
CO2: 25 mmol/L (ref 22–32)
Calcium: 8.6 mg/dL — ABNORMAL LOW (ref 8.9–10.3)
Chloride: 105 mmol/L (ref 98–111)
Creatinine, Ser: 0.86 mg/dL (ref 0.44–1.00)
GFR calc Af Amer: >60 mL/min (ref >60)
GFR calc non Af Amer: >60 mL/min (ref >60)
Glucose, Bld: 118 mg/dL — ABNORMAL HIGH (ref 70–99)
Potassium: 4.1 mmol/L (ref 3.5–5.1)
Sodium: 137 mmol/L (ref 135–145)

## 2019-03-04 LAB — URINALYSIS, ROUTINE W REFLEX MICROSCOPIC
Bilirubin Urine: NEGATIVE
Glucose, UA: NEGATIVE mg/dL
Ketones, ur: NEGATIVE mg/dL
Leukocytes,Ua: NEGATIVE
Nitrite: NEGATIVE
Protein, ur: NEGATIVE mg/dL
Specific Gravity, Urine: 1.002 — ABNORMAL LOW (ref 1.005–1.030)
pH: 6 (ref 5.0–8.0)

## 2019-03-04 LAB — I-STAT BETA HCG BLOOD, ED (MC, WL, AP ONLY): I-stat hCG, quantitative: <5 m[IU]/mL (ref <5)

## 2019-03-04 MED ORDER — AMOXICILLIN-POT CLAVULANATE 875-125 MG PO TABS: 1.0000 | ORAL_TABLET | Freq: Two times a day (BID) | ORAL | 0 refills | Status: DC

## 2019-03-04 MED ORDER — SODIUM CHLORIDE 0.9% FLUSH: 3.0000 mL | Freq: Once | INTRAVENOUS | Status: DC

## 2019-03-04 NOTE — ED Provider Notes (Signed)
[facility] EMERGENCY DEPARTMENT Provider Note   CSN: [id number] Arrival date & time: 03/04/19  1013     History   Chief Complaint Chief Complaint  Patient presents with   Weakness    HPI Sue Blair is a 53 y.o. female.     The history is provided by the patient and medical records. No language interpreter was used.  Weakness    53 year old female with history of anxiety, irritable bowel syndrome, migraine, anemia presenting complaining of leg weakness.  Patient report yesterday she was doing fine until she went inside her house when she experienced an acute onset of tingling sensation to her hands and feet bilaterally.  At that time she feels very weak, and sweaty.  Episode lasting approximately 20 minutes.  Her mom did call EMS who arrived and felt that this may likely be a panic attack.  Her blood sugar at that time was normal and her blood pressure was a bit soft 88 systolic.  Symptoms subsequently resolved but this morning when waking up she still experiencing mild tingling sensation to both of her lower legs without any significant weakness.  She did call and talk to her doctor who recommend patient to come to ER for stroke work-up.  Patient admits that she has history of migraine headache and has had occasional sharp shooting pain to the left side of her head.  Headache is mild, mild headache yesterday, no associated fever, chills, vision changes, diplopia, vision loss, chest pain, shortness of breath, productive cough abdominal pain or back pain or dysuria.  No bowel bladder incontinence or saddle anesthesia.  She denies feeling stressed out.  She denies any history of diabetes.  Past Medical History:  Diagnosis Date   Anxiety    Chronic headaches    Colitis    lymphocytic colitis - resolved   Gastric polyp    GERD (gastroesophageal reflux disease) 06/17/2007   Hiatal hernia 06/17/2007   History of colon polyps    IBS (irritable bowel syndrome)     Migraines    Osteopenia    Tennis elbow     Patient Active Problem List   Diagnosis Date Noted   Paresthesias 01/29/2015   Constipation 01/04/2012   Internal hemorrhoids 01/04/2012   ANEMIA, B12 DEFICIENCY 05/29/2008   ANXIETY 08/20/2007   COLITIS 08/20/2007   IRRITABLE BOWEL SYNDROME 08/20/2007   HEMOCCULT POSITIVE STOOL 08/20/2007   OSTEOPENIA 08/20/2007   ALLERGY 08/20/2007    Past Surgical History:  Procedure Laterality Date   AUGMENTATION MAMMAPLASTY Bilateral    BREAST ENHANCEMENT SURGERY     CESAREAN SECTION  1998   COLONOSCOPY     LAPAROSCOPIC ASSISTED VAGINAL HYSTERECTOMY  10-2013   POLYPECTOMY       OB History   No obstetric history on file.      Home Medications    Prior to Admission medications   Medication Sig Start Date End Date Taking? Authorizing Provider  ALPRAZolam Duanne Moron) 0.5 MG tablet Take 0.5 mg by mouth at bedtime as needed.    [provider]  citalopram (CELEXA) 40 MG tablet Take 40 mg by mouth daily.  08/27/13   [provider]  cyanocobalamin (,VITAMIN B-12,) 1000 MCG/ML injection Inject 1 mL (1,000 mcg total) into the muscle every 30 (thirty) days. 03/30/15   Armbruster, Carlota Raspberry, MD  DEXILANT 60 MG capsule Take 60 capsules by mouth daily. 05/05/17   [provider]  diclofenac sodium (VOLTAREN) 1 % GEL Apply 1-2 application  topically 3 (three) times daily as needed. 04/10/17   [provider]  estradiol (VIVELLE-DOT) 0.05 MG/24HR patch Place 1 patch onto the skin 2 (two) times a week.    [provider]  ibuprofen (ADVIL,MOTRIN) 200 MG tablet Take 600 mg by mouth every 6 (six) hours as needed for headache or moderate pain.    [provider]  lamoTRIgine (LAMICTAL) 100 MG tablet Take 200 mg by mouth daily.     [provider]  ondansetron (ZOFRAN) 4 MG tablet Take 1 tablet (4 mg total) by mouth every 8 (eight) hours as needed for nausea or vomiting. 05/07/17    Fayrene Helperran, Karan Inclan, PA-C  prochlorperazine (COMPAZINE) 10 MG tablet Take 1 tablet (10 mg total) by mouth 2 (two) times daily as needed (Headache/ Nausea). 09/18/16   Pisciotta, Joni ReiningNicole, PA-C  rizatriptan (MAXALT) 10 MG tablet Take 10 mg by mouth every 6 (six) hours as needed for migraine. May repeat in 2 hours if needed    [provider]    Family History Family History  Problem Relation Age of Onset   Breast cancer Mother 2150   Breast cancer Maternal Aunt 7961   Breast cancer Maternal Grandmother 3350   Colon cancer Maternal Uncle    Colon cancer Paternal Uncle    Crohn's disease Paternal Uncle    Diabetes Father    Esophageal cancer Neg Hx    Rectal cancer Neg Hx    Stomach cancer Neg Hx     Social History Social History   Tobacco Use   Smoking status: Former Smoker    Packs/day: 0.50    Years: 5.00    Pack years: 2.50    Types: Cigarettes    Quit date: 05/22/1985    Years since quitting: 33.8   Smokeless tobacco: Never Used  Substance Use Topics   Alcohol use: No    Alcohol/week: 0.0 standard drinks   Drug use: No     Allergies   Clarithromycin and Sulfonamide derivatives   Review of Systems Review of Systems  Neurological: Positive for weakness.  All other systems reviewed and are negative.    Physical Exam Updated Vital Signs BP 140/79 (BP Location: Left Arm)    Pulse 87    Temp 98.7 F (37.1 C) (Oral)    Resp 16    LMP 04/14/2011    SpO2 100%   Physical Exam Vitals signs and nursing note reviewed.  Constitutional:      General: She is not in acute distress.    Appearance: She is well-developed.  HENT:     Head: Atraumatic.  Eyes:     Extraocular Movements: Extraocular movements intact.     Conjunctiva/sclera: Conjunctivae normal.     Pupils: Pupils are equal, round, and reactive to light.  Neck:     Musculoskeletal: Neck supple. No neck rigidity.  Cardiovascular:     Rate and Rhythm: Normal rate and regular rhythm.     Pulses:  Normal pulses.     Heart sounds: Normal heart sounds.  Pulmonary:     Effort: Pulmonary effort is normal.     Breath sounds: Normal breath sounds.  Abdominal:     Palpations: Abdomen is soft.  Skin:    Findings: No rash.  Neurological:     Mental Status: She is alert and oriented to person, place, and time.     GCS: GCS eye subscore is 4. GCS verbal subscore is 5. GCS motor subscore is 6.  Cranial Nerves: Cranial nerves are intact.     Sensory: Sensation is intact.     Motor: Motor function is intact.     Coordination: Coordination is intact.     Gait: Gait is intact.  Psychiatric:        Attention and Perception: Attention normal.      ED Treatments / Results  Labs (all labs ordered are listed, but only abnormal results are displayed) Labs Reviewed  BASIC METABOLIC PANEL - Abnormal; Notable for the following components:      Result Value   Glucose, Bld 118 (*)    Calcium 8.6 (*)    All other components within normal limits  CBC - Abnormal; Notable for the following components:   WBC 3.3 (*)    All other components within normal limits  URINALYSIS, ROUTINE W REFLEX MICROSCOPIC - Abnormal; Notable for the following components:   Color, Urine COLORLESS (*)    Specific Gravity, Urine 1.002 (*)    Hgb urine dipstick SMALL (*)    Bacteria, UA RARE (*)    All other components within normal limits  CBG MONITORING, ED  I-STAT BETA HCG BLOOD, ED ([facility], [facility], [facility] ONLY)    EKG None  Radiology Ct Head Wo Contrast  Result Date: 03/04/2019 CLINICAL DATA:  Patient with numbness and tingling. EXAM: CT HEAD WITHOUT CONTRAST TECHNIQUE: Contiguous axial images were obtained from the base of the skull through the vertex without intravenous contrast. COMPARISON:  None. FINDINGS: Brain: Ventricles and sulci are appropriate for patient's age. No evidence for acute cortically based infarct, intracranial hemorrhage, mass lesion or mass-effect. Vascular: Unremarkable Skull: Intact.  Sinuses/Orbits: Layering fluid within the sphenoid sinus. Remainder the paranasal sinuses are unremarkable. Mastoid air cells unremarkable. Orbits are unremarkable. Other: None. IMPRESSION: No acute intracranial process. Layering fluid within the sphenoid sinus as can be seen with sinusitis. Electronically Signed   By: Annia Beltrew  Davis M.D.   On: 03/04/2019 13:45   Mr Brain Wo Contrast (neuro Protocol)  Result Date: 03/04/2019 CLINICAL DATA:  Focal neuro deficit, greater than 6 hours, stroke suspected. Additional history: Headache, numbness bilateral lower extremities yesterday. EXAM: MRI HEAD WITHOUT CONTRAST TECHNIQUE: Multiplanar, multiecho pulse sequences of the brain and surrounding structures were obtained without intravenous contrast. COMPARISON:  Head CT 03/04/2019, brain MRI 08/29/2005. FINDINGS: Brain: No restricted diffusion is demonstrated to suggest acute or recent subacute infarction. No evidence of intracranial mass or intracranial hemorrhage. No midline shift or extra-axial collection. Mild scattered T2/FLAIR hyperintensity within the cerebral white matter and brainstem is consistent with chronic small vessel ischemic disease and slightly progressed as compared to prior MRI 08/29/2005. Redemonstrated small chronic lacunar infarct within the left cerebellum. Symmetric T2 hyperintensity within the globus pallidus bilaterally is similar to prior MRI, and may reflect sequela of remote insult. Cerebral volume is age appropriate. Vascular: Flow voids maintained within the proximal large vessels. Skull and upper cervical spine: Normal marrow signal. Reversal of the expected cervical lordosis. 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 Small air-fluid levels within the bilateral sphenoid sinuses. No significant mastoid effusion. IMPRESSION: No evidence of acute intracranial abnormality. Mild chronic small vessel ischemic disease slightly progressed from MRI 08/29/2005.  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 Unchanged symmetric signal changes within the globus pallidus bilaterally, possibly reflecting sequela of remote insult. Small bilateral sphenoid sinus air-fluid levels. Correlate for acute sinusitis. Electronically Signed   By: Jackey LogeKyle  Golden   On: 03/04/2019 17:11    Procedures Procedures (including  critical care time)  Medications Ordered in ED Medications  sodium chloride flush (NS) 0.9 % injection 3 mL (has no administration in time range)     Initial Impression / Assessment and Plan / ED Course  I have reviewed the triage vital signs and the nursing notes.  Pertinent labs & imaging results that were available during my care of the patient were reviewed by me and considered in my medical decision making (see chart for details).        BP 124/66 (BP Location: Right Arm)    Pulse 70    Temp 98.2 F (36.8 C) (Oral)    Resp 12    LMP 04/14/2011    SpO2 100%    Final Clinical Impressions(s) / ED Diagnoses   Final diagnoses:  Weakness    ED Discharge Orders         Ordered    amoxicillin-clavulanate (AUGMENTIN) 875-125 MG tablet  2 times daily     03/04/19 1850         12:36 PM Patient report and isolated episodes of tingling and weakness sensation to all 4 extremities yesterday lasting for approximately 20 minutes which is since resolved.  She discussed this with her PCP who recommended ER visit for stroke rule out.  At this time she has no focal neuro deficit on exam.  She does have history of migraine and did report intermittent left-sided sharp pain in her head.  Agree complicated migraine causing her symptom.  Her labs otherwise reassuring, no hypoglycemia, no anemia, no significant electrolyte derangement.  6:44 PM Normal CBG, UA with small amount of hemoglobin and urine dipsticks but no evidence of infection, pregnancy test is negative, her lites are reassuring, head CT scan shows no acute intracranial  process.  There is some layering fluid in the sphenoid sinus which can be seen in sinusitis.  Patient does request brain MRI due to her concerns of potential stroke.  I discussed this with Dr. Lockie Molauratolo who has also evaluated patient and per her request an MRI of the brain was obtained.  MRI shows no evidence of acute intracranial abnormalities.  There is some redemonstrates a small chronic left cerebellar lacunar infarct but no acute finding.  Small bilateral sphenoid sinus air-fluid level were noted to suggest potential sinusitis.  Patient however does not complain of any sinus drainage aside from occasional left-sided headache.  I will prescribe Augmentin for potential sinusitis.  Otherwise encourage patient to follow-up outpatient for further evaluation of her condition.   Fayrene Helperran, Sie Formisano, PA-C 03/04/19 1850    Virgina Norfolkuratolo, Adam, DO 03/04/19 1916

## 2019-03-04 NOTE — ED Triage Notes (Signed)
Pt arrives to ED with c/o of bilateral leg weakness and tingling. Pt states yesterday afternoon she pulled into her moms driveway and suddenly felt weak all over she was able to walk into her moms house and suddenly pt states she began to hyperventilate - her hands cramped up and her mother called ems. By the time ems had got to the house patients states she was some better and was told by ems that she had a panic attack. Pt states since that episode she has felt better however both her her legs continue to tingle and feel weak. Pt is alert and ox4, no weakness in strength - clear speech.

## 2019-03-04 NOTE — ED Provider Notes (Signed)
Medical screening examination/treatment/procedure(s) were conducted as a shared visit with non-physician practitioner(s) and myself.  I personally evaluated the patient during the encounter. Briefly, the patient is a 53 y.o. female with history of anxiety, IBS who presents to the ED with numbness.  Patient with normal vitals.  No fever.  Patient yesterday had episode of numbness in all 4 extremities.  States that she has burning to the bottom of her feet.  She was sent here for stroke work-up by her primary care doctor.  Overall she is asymptomatic and neurologically intact.  Does not actually have any numbness on exam.  She states that yesterday she had an episode where she had numbness throughout her body and that both hands seized up on her.  She did not lose consciousness.  History and physical is not consistent with stroke or seizure but will order MRI to fully rule out as she does have a severe concern about this and she did have some neurological symptoms that could be an atypical stroke.  Head CT is normal.  Lab work overall unremarkable.  If MRI is negative we will have her follow-up with her primary care doctor.  Do not believe this was seizure.  Could be anxiety if MRI is negative.   EKG Interpretation None          Lennice Sites, DO 03/04/19 1455

## 2019-03-04 NOTE — Discharge Instructions (Signed)
You have been evaluated for your leg weakness.  Fortunately no evidence of acute stroke.  Your labs are reassuring.  MRI did show some changes which may suggest sinusitis, or inflammation of your sinus.  This finding would not account for your weakness.  However, if you have recurrent headache or nasal drainage, then take Augmentin as treatment.  Follow up with your doctor for further care.  Return if you have any concerns.

## 2019-03-04 NOTE — ED Notes (Signed)
Patient transported to MRI 

## 2019-10-25 ENCOUNTER — Other Ambulatory Visit: Payer: Self-pay | Admitting: Obstetrics & Gynecology

## 2019-10-25 DIAGNOSIS — Z1231 Encounter for screening mammogram for malignant neoplasm of breast: Secondary | ICD-10-CM

## 2019-11-11 ENCOUNTER — Ambulatory Visit: Payer: BC Managed Care – PPO

## 2019-12-27 ENCOUNTER — Other Ambulatory Visit: Payer: Self-pay

## 2019-12-27 ENCOUNTER — Ambulatory Visit
Admission: RE | Admit: 2019-12-27 | Discharge: 2019-12-27 | Disposition: A | Discharge status: Home / Self Care | Payer: BC Managed Care – PPO | Source: Ambulatory Visit | Attending: Obstetrics & Gynecology | Admitting: Obstetrics & Gynecology

## 2019-12-27 DIAGNOSIS — Z1231 Encounter for screening mammogram for malignant neoplasm of breast: Secondary | ICD-10-CM

## 2019-12-29 ENCOUNTER — Other Ambulatory Visit: Payer: Self-pay | Admitting: Obstetrics & Gynecology

## 2019-12-29 DIAGNOSIS — R928 Other abnormal and inconclusive findings on diagnostic imaging of breast: Secondary | ICD-10-CM

## 2020-01-09 ENCOUNTER — Other Ambulatory Visit: Payer: Self-pay

## 2020-01-09 ENCOUNTER — Other Ambulatory Visit: Payer: Self-pay | Admitting: Obstetrics & Gynecology

## 2020-01-09 ENCOUNTER — Ambulatory Visit
Admission: RE | Admit: 2020-01-09 | Discharge: 2020-01-09 | Disposition: A | Discharge status: Home / Self Care | Payer: BC Managed Care – PPO | Source: Ambulatory Visit | Attending: Obstetrics & Gynecology | Admitting: Obstetrics & Gynecology

## 2020-01-09 ENCOUNTER — Ambulatory Visit: Payer: BC Managed Care – PPO

## 2020-01-09 DIAGNOSIS — R928 Other abnormal and inconclusive findings on diagnostic imaging of breast: Secondary | ICD-10-CM

## 2020-05-22 ENCOUNTER — Other Ambulatory Visit: Payer: Self-pay | Admitting: General Practice

## 2020-05-22 DIAGNOSIS — M26639 Articular disc disorder of temporomandibular joint, unspecified side: Secondary | ICD-10-CM

## 2020-06-10 ENCOUNTER — Ambulatory Visit
Admission: RE | Admit: 2020-06-10 | Discharge: 2020-06-10 | Disposition: A | Discharge status: Home / Self Care | Payer: Self-pay | Source: Ambulatory Visit | Attending: General Practice | Admitting: General Practice

## 2020-06-10 DIAGNOSIS — M26639 Articular disc disorder of temporomandibular joint, unspecified side: Secondary | ICD-10-CM

## 2020-07-09 IMAGING — CT CT HEAD WITHOUT CONTRAST
3 series · 16 of 47 positions shown, 19 images · non-contrast
Comparison: None.

CLINICAL DATA: Patient with numbness and tingling.

EXAM:
CT HEAD WITHOUT CONTRAST
TECHNIQUE: Contiguous axial images were obtained from the base of the skull
through the vertex without intravenous contrast.

[Series 3: head 5.0 h30s · axial · 0.41mm/px · z∈[-125,+5]mm · 10 of 32 slices shown, 13 images]
[im 3/32  brain]
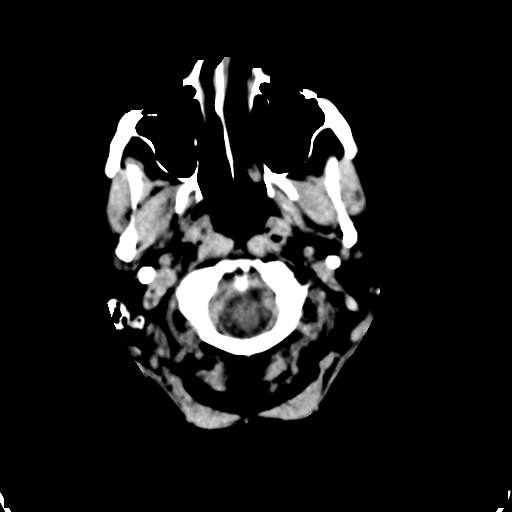
[im 3/32  bone]
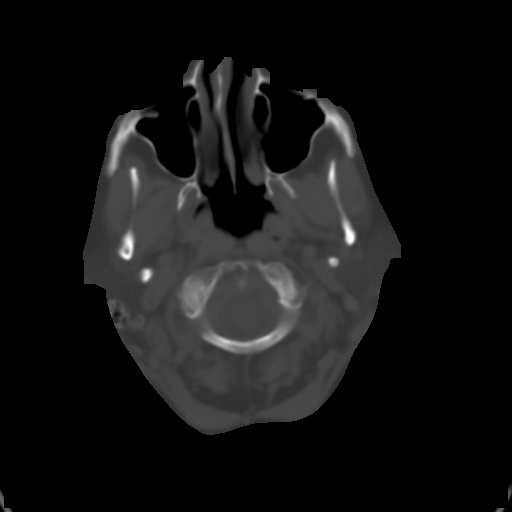
[im 6/32  brain]
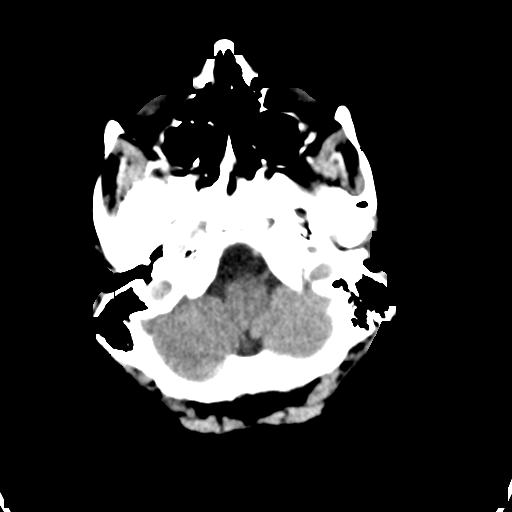
[im 9/32  brain]
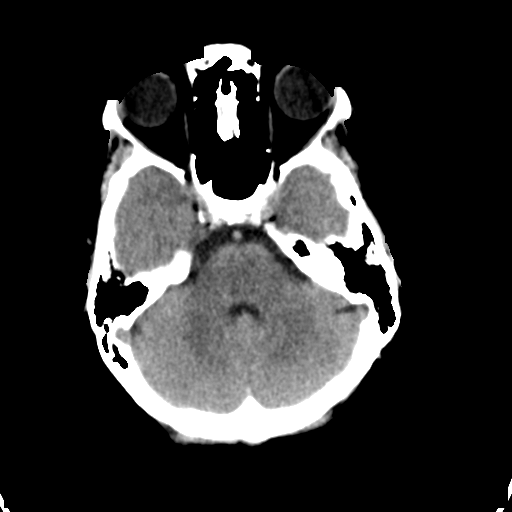
[im 11/32  brain]
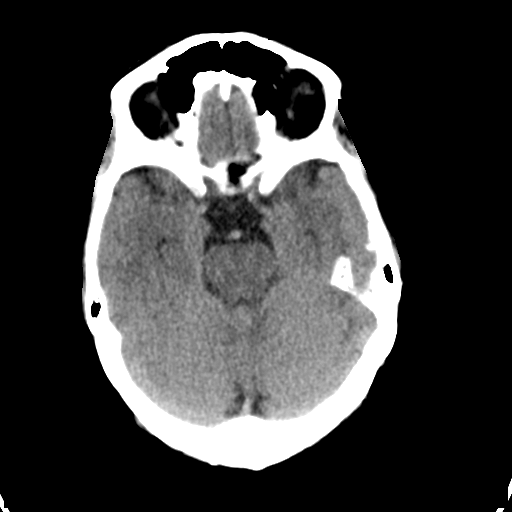
[im 14/32  brain]
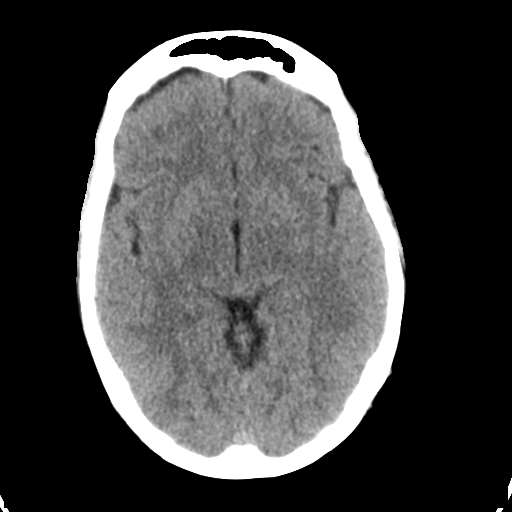
[im 14/32  bone]
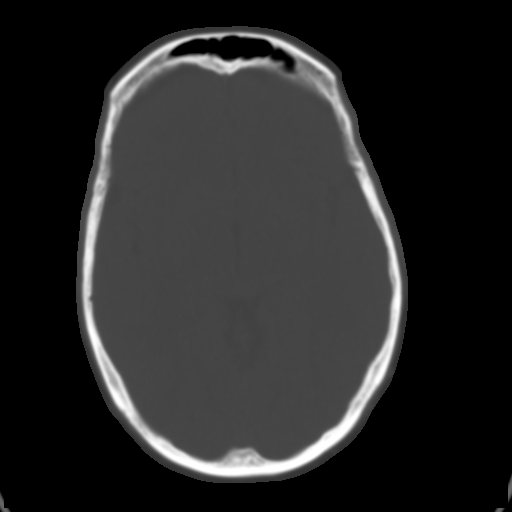
[im 18/32  brain]
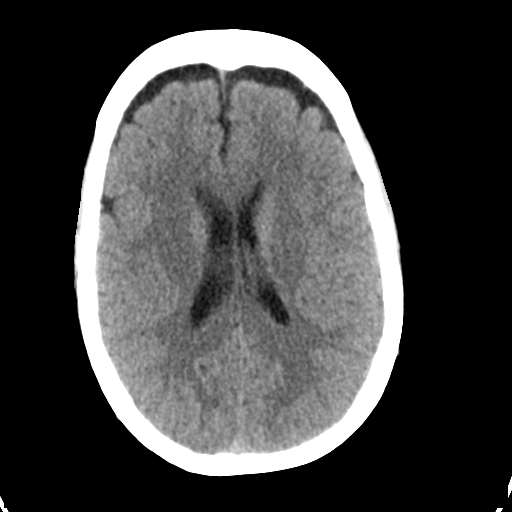
[im 21/32  brain]
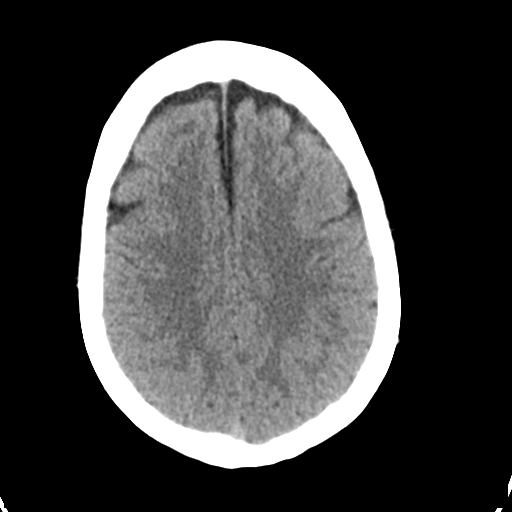
[im 24/32  brain]
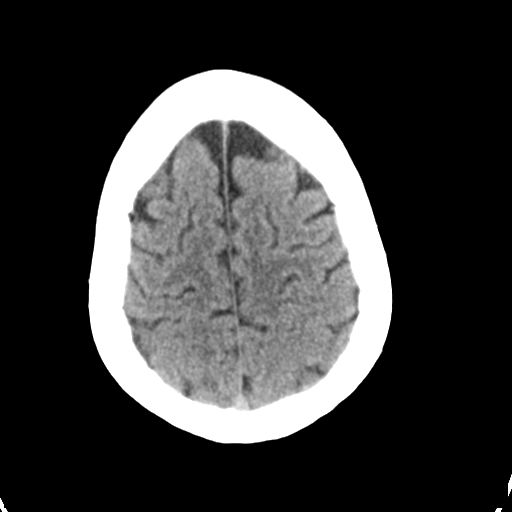
[im 26/32  brain]
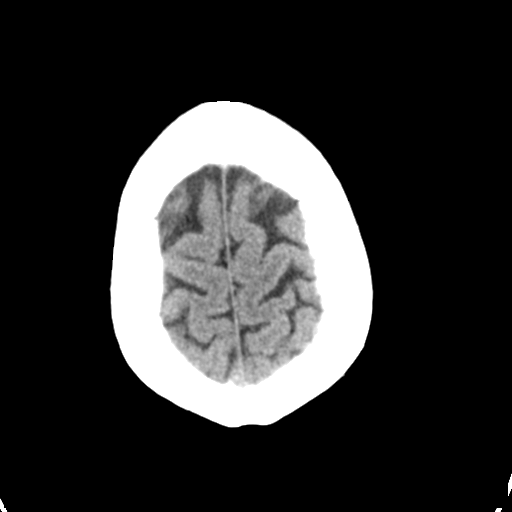
[im 26/32  bone]
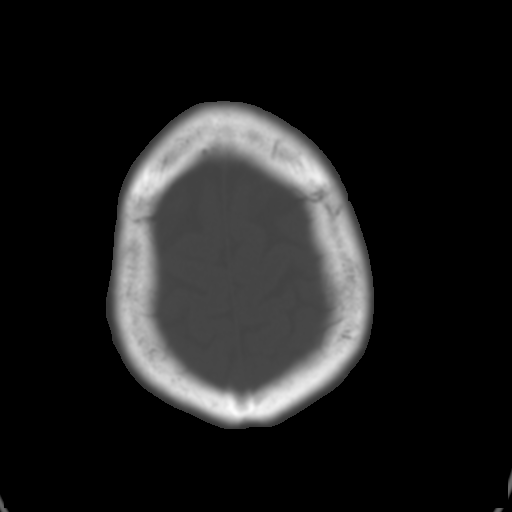
[im 29/32  brain]
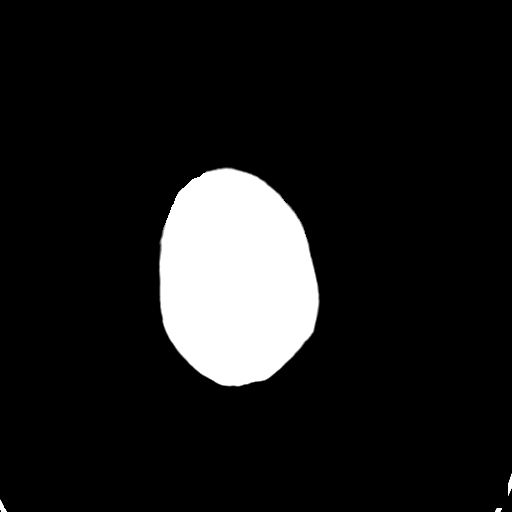

[Series 5: head 3.0 mpr cor · coronal · 0.30mm/px · 3 of 67 slices shown]
[im 23/67  brain]
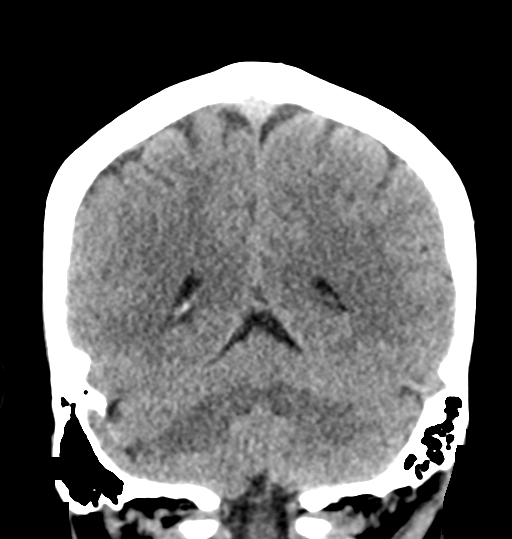
[im 30/67  brain]
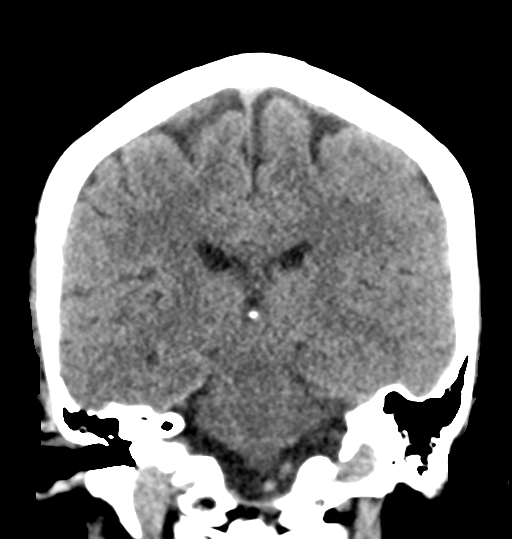
[im 37/67  brain]
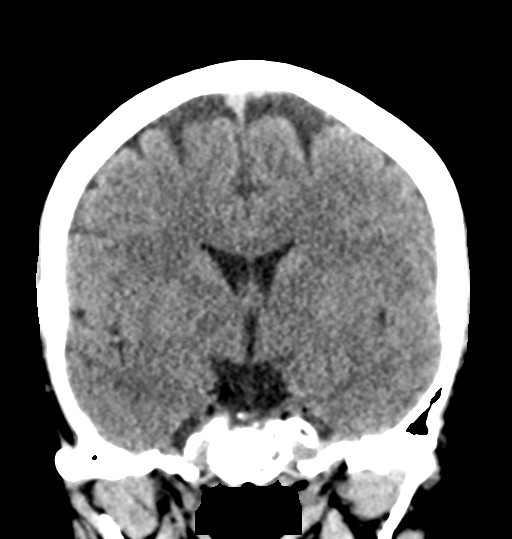

[Series 6: head 3.0 mpr sag · sagittal · 0.31mm/px · 3 of 52 slices shown]
[im 18/52  brain]
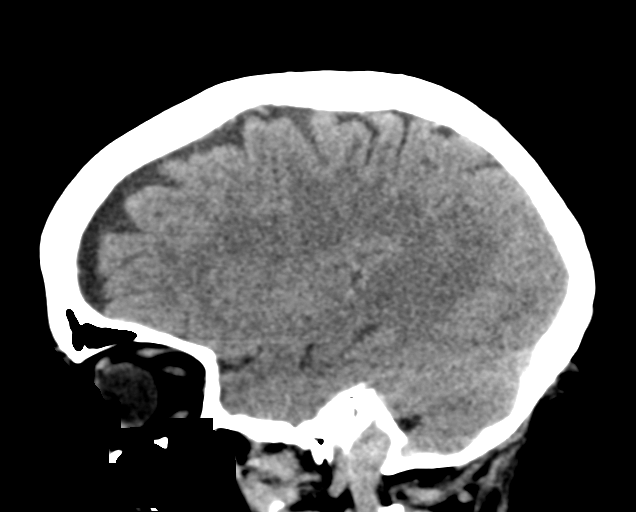
[im 26/52  brain]
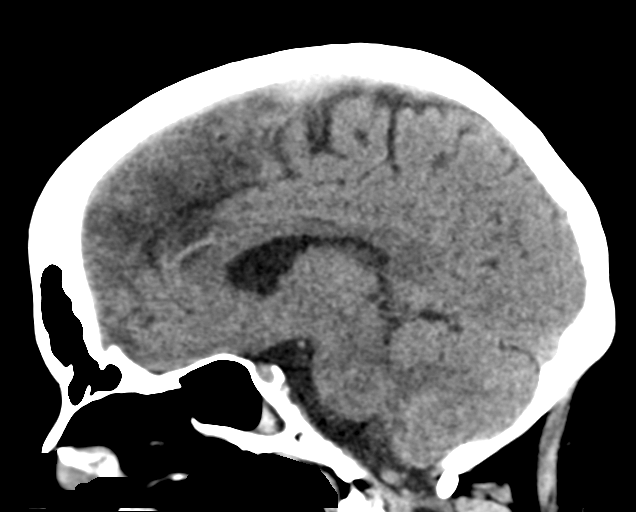
[im 35/52  brain]
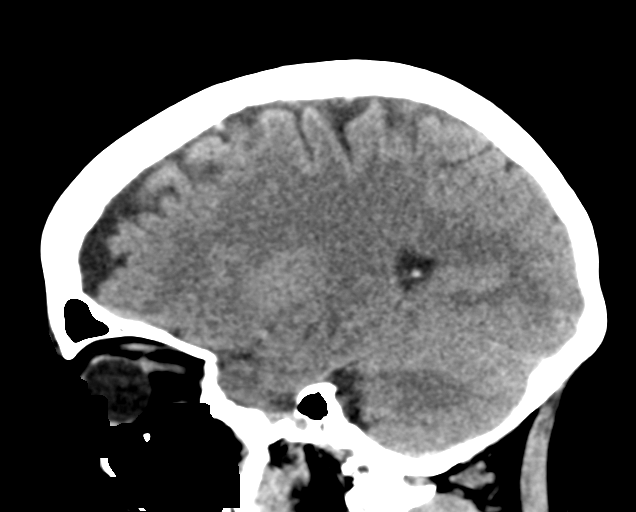

[16 of 47 positions shown; findings below may reference images not displayed]

FINDINGS: Brain: Ventricles and sulci are appropriate for patient's age. No
evidence for acute cortically based infarct, intracranial
hemorrhage, mass lesion or mass-effect.

Vascular: Unremarkable

Skull: Intact.

Sinuses/Orbits: Layering fluid within the sphenoid sinus. Remainder
the paranasal sinuses are unremarkable. Mastoid air cells
unremarkable. Orbits are unremarkable.

Other: None.
IMPRESSION: No acute intracranial process.

Layering fluid within the sphenoid sinus as can be seen with
sinusitis.

## 2021-01-09 ENCOUNTER — Other Ambulatory Visit: Payer: Self-pay | Admitting: Obstetrics & Gynecology

## 2021-01-09 DIAGNOSIS — Z1231 Encounter for screening mammogram for malignant neoplasm of breast: Secondary | ICD-10-CM

## 2021-01-12 ENCOUNTER — Ambulatory Visit
Admission: RE | Admit: 2021-01-12 | Discharge: 2021-01-12 | Disposition: A | Discharge status: Home / Self Care | Payer: BC Managed Care – PPO | Source: Ambulatory Visit | Attending: Obstetrics & Gynecology | Admitting: Obstetrics & Gynecology

## 2021-01-12 DIAGNOSIS — Z1231 Encounter for screening mammogram for malignant neoplasm of breast: Secondary | ICD-10-CM

## 2021-11-14 ENCOUNTER — Telehealth: Payer: Self-pay | Admitting: Gastroenterology

## 2021-11-14 NOTE — Telephone Encounter (Signed)
Dr. Fuller Plan are you okay with taking on care for patient? ?

## 2021-11-14 NOTE — Telephone Encounter (Signed)
Good Morning Dr. Adela Lank, ? ? ?Patient called wanting to transfer care over to Dr. Russella Dar to be seen for acid reflux issues and change in bowel habits. Patient was last seen by you in 2017. Are you okay with Dr. Russella Dar taking on patients care? ? ? ?Please advise.  ?

## 2021-11-15 ENCOUNTER — Encounter: Payer: Self-pay | Admitting: Gastroenterology

## 2021-11-15 NOTE — Telephone Encounter (Signed)
Spoke with patient, patient was scheduled for 5/23 at 8:50 with Dr. Russella Dar.  ?

## 2021-12-03 ENCOUNTER — Other Ambulatory Visit: Payer: Self-pay | Admitting: Internal Medicine

## 2021-12-03 ENCOUNTER — Other Ambulatory Visit: Payer: Self-pay | Admitting: Obstetrics & Gynecology

## 2021-12-03 DIAGNOSIS — Z1231 Encounter for screening mammogram for malignant neoplasm of breast: Secondary | ICD-10-CM

## 2021-12-24 ENCOUNTER — Encounter: Payer: Self-pay | Admitting: Gastroenterology

## 2021-12-24 ENCOUNTER — Ambulatory Visit: Payer: BC Managed Care – PPO | Admitting: Gastroenterology

## 2021-12-24 VITALS — BP 110/78 | HR 88 | Ht 65.5 in | Wt 143.0 lb

## 2021-12-24 DIAGNOSIS — R194 Change in bowel habit: Secondary | ICD-10-CM | POA: Diagnosis not present

## 2021-12-24 DIAGNOSIS — K2100 Gastro-esophageal reflux disease with esophagitis, without bleeding: Secondary | ICD-10-CM

## 2021-12-24 MED ORDER — NA SULFATE-K SULFATE-MG SULF 17.5-3.13-1.6 GM/177ML PO SOLN: 1.0000 | Freq: Once | ORAL | 0 refills | Status: AC

## 2021-12-24 MED ORDER — PANTOPRAZOLE SODIUM 40 MG PO TBEC: 40.0000 mg | DELAYED_RELEASE_TABLET | Freq: Two times a day (BID) | ORAL | 11 refills | Status: DC

## 2021-12-24 NOTE — Patient Instructions (Signed)
Increase your pantoprazole 40 mg to twice daily. A new prescription has been sent to your pharmacy.   You can start over the counter Miralax mixing 17 grams in 8 oz of water 1-2 x daily. Titrate depending on your bowel movements.   You have been scheduled for an endoscopy and colonoscopy. Please follow the written instructions given to you at your visit today. Please pick up your prep supplies at the pharmacy within the next 1-3 days. If you use inhalers (even only as needed), please bring them with you on the day of your procedure.  The [facility] providers would like to encourage you to use [facility] to communicate with providers for non-urgent requests or questions.  Due to long hold times on the telephone, sending your provider a message by [facility] may be a faster and more efficient way to get a response.  Please allow 48 business hours for a response.  Please remember that this is for non-urgent requests.   Due to recent changes in healthcare laws, you may see the results of your imaging and laboratory studies on MyChart before your provider has had a chance to review them.  We understand that in some cases there may be results that are confusing or concerning to you. Not all laboratory results come back in the same time frame and the provider may be waiting for multiple results in order to interpret others.  Please give [place] 48 hours in order for your provider to thoroughly review all the results before contacting the office for clarification of your results.   Thank you for choosing me and [facility].  Venita Lick. Pleas Koch., MD., Clementeen Graham

## 2021-12-24 NOTE — Progress Notes (Signed)
Assessment    GERD with LA Grade A esophagitis with worsening symptoms Change in bowel habits, constipation with occasional small volume diarrhea and mucus. R/O colorectal neoplasms Remote history of lymphocytic colitis  Recommendations   Increase pantoprazole to 40 mg po bid and follow antireflux measures Begin Miralax 1-2 daily titrated for a complete bowel movement daily Schedule colonoscopy and EGD. The risks (including bleeding, perforation, infection, missed lesions, medication reactions and possible hospitalization or surgery if complications occur), benefits, and alternatives to colonoscopy with possible biopsy and possible polypectomy were discussed with the patient and they consent to proceed.  The risks (including bleeding, perforation, infection, missed lesions, medication reactions and possible hospitalization or surgery if complications occur), benefits, and alternatives to endoscopy with possible biopsy and possible dilation were discussed with the patient and they consent to proceed.     HPI   Chief complaint: GERD, change in bowel habits  Patient profile:  Sue Blair is a 56 y.o. female referred by Iran Ouch, MD for evaluation of GERD and change in bowel habits.  She was previously followed by Dr. Adela Lank with her last visit in May 2017.  She has a history of lymphocytic colitis on colonoscopy in 1995 but did not have a recurrence on colonoscopies in 2008 in 2015.  Her GERD was managed with Dexilant daily which was effective.  Due to insurance coverage changes she switched to pantoprazole 40 mg daily in March and her reflux symptoms have been more active with occasional regurgitation occasional heartburn and occasional globus sensation.  She notes a change in bowel habits around January of this year with frequent constipation, incomplete fecal evacuation and occasional small looser stools with mucus.  She feels ongoing constipation as her main problem. She has a history  of B12 deficiency.  Most recent EGD and colonoscopy below.  Denies weight loss, abdominal pain, change in stool caliber, melena, hematochezia, nausea, vomiting, dysphagia, chest pain. See PMH below for additional history.   Previous Labs / Imaging::    Latest Ref Rng & Units 03/04/2019   10:35 AM 09/18/2016    6:42 AM 10/03/2010    4:07 PM  CBC  WBC 4.0 - 10.5 K/uL 3.3    4.9    Hemoglobin 12.0 - 15.0 g/dL 56.4   33.2   95.1    Hematocrit 36.0 - 46.0 % 41.9   40.0   38.1    Platelets 150 - 400 K/uL 251    320.0      No results found for: LIPASE    Latest Ref Rng & Units 03/04/2019   10:35 AM 09/18/2016    6:42 AM  CMP  Glucose 70 - 99 mg/dL 884   166    BUN 6 - 20 mg/dL 8   14    Creatinine 0.63 - 1.00 mg/dL 0.16   0.10    Sodium 932 - 145 mmol/L 137   140    Potassium 3.5 - 5.1 mmol/L 4.1   3.4    Chloride 98 - 111 mmol/L 105   105    CO2 22 - 32 mmol/L 25     Calcium 8.9 - 10.3 mg/dL 8.6        Previous GI evaluation    Endoscopies:  EGD Oct 2016 *LA Grade A esophagitis *Subtle Schatzki ring - this was not dilated  *1-2 cm hiatal hernia *Multiple benign appearing small gastric polyps, a few removed with cold forceps. *Normal remainder examined stomach and duodenum. Path:  benign fundic gland polyp   Colonoscopy Dec 2015 *Melanosis coli throughout the colon predominantly in the sigmoid colon mild sigmoid diverticulosis *Random biopsies of the colon to rule out recurrent lymphocytic colitis Path: normal mucosa with melanosis colit  Imaging:     Past Medical History:  Diagnosis Date   Anxiety    Chronic headaches    Colitis    lymphocytic colitis - resolved   Gastric polyp    GERD (gastroesophageal reflux disease) 06/17/2007   Hiatal hernia 06/17/2007   History of colon polyps    IBS (irritable bowel syndrome)    Migraines    Osteopenia    Tennis elbow    Past Surgical History:  Procedure Laterality Date   AUGMENTATION MAMMAPLASTY Bilateral    BREAST  ENHANCEMENT SURGERY     CESAREAN SECTION  1998   COLONOSCOPY     LAPAROSCOPIC ASSISTED VAGINAL HYSTERECTOMY  10-2013   POLYPECTOMY     Family History  Problem Relation Age of Onset   Breast cancer Mother 7050   Breast cancer Maternal Aunt 61   Breast cancer Maternal Grandmother 50   Colon cancer Maternal Uncle    Colon cancer Paternal Uncle    Crohn's disease Paternal Uncle    Diabetes Father    Breast cancer Cousin    Esophageal cancer Neg Hx    Rectal cancer Neg Hx    Stomach cancer Neg Hx    Social History   Tobacco Use   Smoking status: Former    Packs/day: 0.50    Years: 5.00    Pack years: 2.50    Types: Cigarettes    Quit date: 05/22/1985    Years since quitting: 36.6   Smokeless tobacco: Never  Vaping Use   Vaping Use: Never used  Substance Use Topics   Alcohol use: No    Alcohol/week: 0.0 standard drinks   Drug use: No   Current Outpatient Medications  Medication Sig Dispense Refill   ALPRAZolam (XANAX) 0.5 MG tablet Take 0.5 mg by mouth at bedtime as needed.     amoxicillin-clavulanate (AUGMENTIN) 875-125 MG tablet Take 1 tablet by mouth 2 (two) times daily. One po bid x 7 days 14 tablet 0   citalopram (CELEXA) 40 MG tablet Take 40 mg by mouth daily.      cyanocobalamin (,VITAMIN B-12,) 1000 MCG/ML injection Inject 1 mL (1,000 mcg total) into the muscle every 30 (thirty) days. 12 mL 0   DEXILANT 60 MG capsule Take 60 capsules by mouth daily.     diclofenac sodium (VOLTAREN) 1 % GEL Apply 1-2 application topically 3 (three) times daily as needed.  3   estradiol (VIVELLE-DOT) 0.05 MG/24HR patch Place 1 patch onto the skin 2 (two) times a week.     ibuprofen (ADVIL,MOTRIN) 200 MG tablet Take 600 mg by mouth every 6 (six) hours as needed for headache or moderate pain.     lamoTRIgine (LAMICTAL) 100 MG tablet Take 200 mg by mouth daily.      ondansetron (ZOFRAN) 4 MG tablet Take 1 tablet (4 mg total) by mouth every 8 (eight) hours as needed for nausea or vomiting.  12 tablet 0   prochlorperazine (COMPAZINE) 10 MG tablet Take 1 tablet (10 mg total) by mouth 2 (two) times daily as needed (Headache/ Nausea). 10 tablet 0   rizatriptan (MAXALT) 10 MG tablet Take 10 mg by mouth every 6 (six) hours as needed for migraine. May repeat in 2 hours if needed  No current facility-administered medications for this visit.   Allergies  Allergen Reactions   Clarithromycin Hives   Sulfonamide Derivatives Hives    Review of Systems: All other systems reviewed and negative except where noted in HPI.    Physical Exam    Wt Readings from Last 3 Encounters:  05/22/17 140 lb (63.5 kg)  05/07/17 136 lb (61.7 kg)  09/18/16 135 lb (61.2 kg)    LMP 04/14/2011  Constitutional:  Generally well appearing female in no acute distress. Psychiatric: Pleasant. Normal mood and affect. Behavior is normal. EENT: Pupils normal.  Conjunctivae are normal. No scleral icterus. Neck supple.  Cardiovascular: Normal rate, regular rhythm. No edema Pulmonary/chest: Effort normal and breath sounds normal. No wheezing, rales or rhonchi. Abdominal: Soft, nondistended, nontender. Bowel sounds active throughout. There are no masses palpable. No hepatomegaly. Rectal: Not done Neurological: Alert and oriented to person place and time. Skin: Skin is warm and dry. No rashes noted.  Claudette Head, MD   cc:  Referring Provider Creola Corn, MD

## 2021-12-28 ENCOUNTER — Other Ambulatory Visit: Payer: Self-pay

## 2021-12-28 ENCOUNTER — Encounter (HOSPITAL_COMMUNITY): Payer: Self-pay

## 2021-12-28 ENCOUNTER — Emergency Department (HOSPITAL_COMMUNITY)
Admission: EM | Admit: 2021-12-28 | Discharge: 2021-12-28 | Disposition: A | Discharge status: Home / Self Care | Payer: BC Managed Care – PPO | Attending: Emergency Medicine | Admitting: Emergency Medicine

## 2021-12-28 DIAGNOSIS — B029 Zoster without complications: Secondary | ICD-10-CM | POA: Insufficient documentation

## 2021-12-28 DIAGNOSIS — R519 Headache, unspecified: Secondary | ICD-10-CM | POA: Diagnosis present

## 2021-12-28 DIAGNOSIS — M791 Myalgia, unspecified site: Secondary | ICD-10-CM | POA: Insufficient documentation

## 2021-12-28 MED ORDER — DIPHENHYDRAMINE HCL 50 MG/ML IJ SOLN
50.0000 mg | Freq: Once | INTRAMUSCULAR | Status: AC
  Administered 2021-12-28: 50 mg via INTRAVENOUS
  Filled 2021-12-28: qty 1

## 2021-12-28 MED ORDER — VALACYCLOVIR HCL 1 G PO TABS: 1000.0000 mg | ORAL_TABLET | Freq: Three times a day (TID) | ORAL | 0 refills | Status: AC

## 2021-12-28 MED ORDER — VALACYCLOVIR HCL 500 MG PO TABS
1000.0000 mg | ORAL_TABLET | Freq: Once | ORAL | Status: AC
  Administered 2021-12-28: 1000 mg via ORAL
  Filled 2021-12-28: qty 2

## 2021-12-28 MED ORDER — FLUORESCEIN SODIUM 1 MG OP STRP
1.0000 | ORAL_STRIP | Freq: Once | OPHTHALMIC | Status: AC
  Administered 2021-12-28: 1 via OPHTHALMIC
  Filled 2021-12-28: qty 1

## 2021-12-28 MED ORDER — SODIUM CHLORIDE 0.9 % IV BOLUS
1000.0000 mL | Freq: Once | INTRAVENOUS | Status: AC
  Administered 2021-12-28: 1000 mL via INTRAVENOUS

## 2021-12-28 MED ORDER — TETRACAINE HCL 0.5 % OP SOLN
2.0000 [drp] | Freq: Once | OPHTHALMIC | Status: AC
  Administered 2021-12-28: 2 [drp] via OPHTHALMIC
  Filled 2021-12-28: qty 4

## 2021-12-28 MED ORDER — OXYCODONE-ACETAMINOPHEN 5-325 MG PO TABS: 1.0000 | ORAL_TABLET | ORAL | 0 refills | Status: DC | PRN

## 2021-12-28 MED ORDER — PROCHLORPERAZINE EDISYLATE 10 MG/2ML IJ SOLN
10.0000 mg | Freq: Once | INTRAMUSCULAR | Status: AC
  Administered 2021-12-28: 10 mg via INTRAVENOUS
  Filled 2021-12-28: qty 2

## 2021-12-28 NOTE — ED Provider Notes (Signed)
[facility] DEPT Provider Note   CSN: [id number] Arrival date & time: 12/28/21  [place]     History  Chief Complaint  Patient presents with   Migraine    Sue Blair is a 56 y.o. female.  The history is provided by the patient and medical records. No language interpreter was used.  Headache Pain location:  R temporal Quality:  Dull Radiates to:  Eyes Severity currently:  8/10 Severity at highest:  8/10 Onset quality:  Gradual Duration:  2 days Timing:  Constant Progression:  Worsening Chronicity:  New Similar to prior headaches: yes (yes and no)   Context: not exposure to bright light, not eating and not loud noise   Relieved by:  Nothing Worsened by:  Nothing Ineffective treatments: home headache medications. Associated symptoms: eye pain, nausea and vomiting   Associated symptoms: no abdominal pain, no back pain, no blurred vision, no congestion, no cough, no diarrhea, no dizziness, no drainage, no ear pain, no fatigue, no fever, no loss of balance, no neck pain, no neck stiffness, no photophobia, no seizures, no sinus pressure, no URI, no visual change and no weakness       Home Medications Prior to Admission medications   Medication Sig Start Date End Date Taking? Authorizing Provider  baclofen (LIORESAL) 10 MG tablet Take 10 mg by mouth 3 (three) times daily as needed. 12/06/21   [provider]  buPROPion (WELLBUTRIN XL) 150 MG 24 hr tablet Take 150 mg by mouth every morning. 12/03/21   [provider]  citalopram (CELEXA) 40 MG tablet Take 40 mg by mouth daily.  08/27/13   [provider]  cyanocobalamin (,VITAMIN B-12,) 1000 MCG/ML injection Inject 1 mL (1,000 mcg total) into the muscle every 30 (thirty) days. 03/30/15   Armbruster, Carlota Raspberry, MD  EMGALITY 120 MG/ML SOAJ Inject 1 mL into the skin every 30 (thirty) days. 12/16/21   [provider]  estradiol (VIVELLE-DOT) 0.05 MG/24HR patch Place 1 patch  onto the skin 2 (two) times a week.    [provider]  ibuprofen (ADVIL,MOTRIN) 200 MG tablet Take 600 mg by mouth every 6 (six) hours as needed for headache or moderate pain.    [provider]  lamoTRIgine (LAMICTAL) 100 MG tablet Take 200 mg by mouth daily.     [provider]  pantoprazole (PROTONIX) 40 MG tablet Take 1 tablet (40 mg total) by mouth 2 (two) times daily. 12/24/21   Ladene Artist, MD  Ubrogepant (UBRELVY) 100 MG TABS Take 100 mg by mouth as needed. 10/22/21   [provider]      Allergies    Clarithromycin and Sulfonamide derivatives    Review of Systems   Review of Systems  Constitutional:  Negative for chills, diaphoresis, fatigue and fever.  HENT:  Negative for congestion, ear pain, postnasal drip and sinus pressure.   Eyes:  Positive for pain. Negative for blurred vision, photophobia, discharge and visual disturbance.  Respiratory:  Negative for cough and chest tightness.   Gastrointestinal:  Positive for nausea and vomiting. Negative for abdominal pain, constipation and diarrhea.  Genitourinary:  Negative for flank pain.  Musculoskeletal:  Negative for back pain, neck pain and neck stiffness.  Skin:  Positive for rash. Negative for wound.  Neurological:  Positive for headaches. Negative for dizziness, seizures, weakness, light-headedness and loss of balance.  Psychiatric/Behavioral:  Negative for agitation and confusion.   All other systems reviewed and are negative.  Physical  Exam Updated Vital Signs BP 117/76   Pulse 81   Temp 98 F (36.7 C) (Oral)   Resp 18   Ht 5\' 5"  (1.651 m)   Wt 63.5 kg   LMP 04/14/2011   SpO2 96%   BMI 23.30 kg/m  Physical Exam Vitals and nursing note reviewed.  Constitutional:      General: She is not in acute distress.    Appearance: She is well-developed. She is not ill-appearing or toxic-appearing.  HENT:     Head: Atraumatic.     Nose: No congestion or rhinorrhea.      Mouth/Throat:     Mouth: Mucous membranes are moist.     Pharynx: No oropharyngeal exudate or posterior oropharyngeal erythema.  Eyes:     General: No scleral icterus.       Right eye: No discharge.        Left eye: No discharge.     Intraocular pressure: Right eye pressure is 10 mmHg. Measurements were taken using an automated tonometer.    Extraocular Movements: Extraocular movements intact.     Right eye: Normal extraocular motion and no nystagmus.     Left eye: Normal extraocular motion and no nystagmus.     Conjunctiva/sclera: Conjunctivae normal.     Right eye: Right conjunctiva is not injected. No chemosis, exudate or hemorrhage.    Pupils: Pupils are equal, round, and reactive to light.     Comments: Patient has some vesicles on her upper eyelid and right forehead and right upper face.  No dendrites seen on eye with fluorescein exam.  Normal pressure.  Cardiovascular:     Rate and Rhythm: Normal rate and regular rhythm.     Heart sounds: No murmur heard. Pulmonary:     Effort: Pulmonary effort is normal. No respiratory distress.     Breath sounds: Normal breath sounds.  Abdominal:     Palpations: Abdomen is soft.     Tenderness: There is no abdominal tenderness. There is no right CVA tenderness, left CVA tenderness, guarding or rebound.  Musculoskeletal:        General: Tenderness present. No swelling.     Cervical back: Neck supple. No tenderness.     Right lower leg: No edema.     Left lower leg: No edema.  Skin:    General: Skin is warm and dry.     Capillary Refill: Capillary refill takes less than 2 seconds.     Findings: Rash present. No erythema.  Neurological:     General: No focal deficit present.     Mental Status: She is alert.     Sensory: No sensory deficit.     Motor: No weakness.  Psychiatric:        Mood and Affect: Mood normal.    ED Results / Procedures / Treatments   Labs (all labs ordered are listed, but only abnormal results are  displayed) Labs Reviewed - No data to display  EKG None  Radiology No results found.  Procedures Procedures    Medications Ordered in ED Medications  sodium chloride 0.9 % bolus 1,000 mL (0 mLs Intravenous Stopped 12/28/21 2048)  prochlorperazine (COMPAZINE) injection 10 mg (10 mg Intravenous Given 12/28/21 1911)  diphenhydrAMINE (BENADRYL) injection 50 mg (50 mg Intravenous Given 12/28/21 1912)  tetracaine (PONTOCAINE) 0.5 % ophthalmic solution 2 drop (2 drops Right Eye Given 12/28/21 1913)  fluorescein ophthalmic strip 1 strip (1 strip Right Eye Given 12/28/21 1913)  valACYclovir (VALTREX) tablet 1,000 mg (1,000  mg Oral Given 12/28/21 2047)    ED Course/ Medical Decision Making/ A&P                           Medical Decision Making Risk Prescription drug management.    Sue Blair is a 56 y.o. female with a past medical history significant for hiatal hernia, GERD, migraines, IBS, and anxiety who presents with headache.  According to patient, for the last 2 days she has had headache that feels different than her normal headaches although she does have some similar features.  She reports a headache and is in her right temple going towards her right eye.  She denies any vision changes including no diplopia or blurry vision.  She reports some nausea and vomiting with a headache.  It is moderate to severe and did not respond to her home migraine medication.  She reports no fevers, chills, ingestion, or cough.  No constipation, diarrhea, or urinary symptoms.  No tick exposures or other acute changes.  She does report some dizziness consistent with her previous migraines but denies significant photophobia or phonophobia.  She reports she has noticed a rash just this evening in her right temple area going towards her right eyelid.  She denies any history of shingles or facial cellulitis.  She does wear contacts.  On exam, lungs clear and chest nontender.  Abdomen nontender.  No stridor or  neck tenderness.  She has some tenderness in her right upper facial area where she has what appears to be vesicular rash.  It is on her right temple, right forehead, and some on her right upper eyelid.  I do not see any on the tip of her nose or in her ear.  No oral rash seen.  No focal neurologic deficits.  Pupils are symmetric and reactive normal extraocular movements.  A fluorescein eye exam was performed and there is no evidence of dendrites, corneal ulcers, or corneal abrasion.  Eye pressure was 10 and normal when it was checked twice.  Rest exam unremarkable.  Clinically I suspect patient has shingles on her face causing her discomfort and is triggering some migrainous type headache as well.  Given her age, and the rash, I have low suspicion for temporal arteritis at this time.  We will give her a headache cocktail to help with the migrainous component of her headache and give her Valtrex for what I suspect is shingles on the face.  I spoke to Dr. Eulas Post with ophthalmology who reviewed the case.  He does feel that is appropriate to do the oral Valtrex and he will see her in clinic in 2 days on Monday to get a better look at her eye.  He does not think she needs any steroid drops and agrees with the plan of care.  We will reassess after headache cocktail and anticipate discharge home to follow-up with ophthalmology and PCP.  Patient reports her headache has significantly improved.  She still having some discomfort but it is not as bad as before.  We discussed the plan to follow-up with Dr. Eulas Post with ophthalmology in 2 days and use the Valtrex and pain medicine.  She agrees with this plan.  She had no other questions or concerns discharged in good condition with understanding return precautions.          Final Clinical Impression(s) / ED Diagnoses Final diagnoses:  Herpes zoster without complication  Bad headache    Rx /  DC Orders ED Discharge Orders          Ordered    valACYclovir  (VALTREX) 1000 MG tablet  3 times daily        12/28/21 2118    oxyCODONE-acetaminophen (PERCOCET/ROXICET) 5-325 MG tablet  Every 4 hours PRN        12/28/21 2118            Clinical Impression: 1. Herpes zoster without complication   2. Bad headache     Disposition: Discharge  Condition: Good  I have discussed the results, Dx and Tx plan with the pt(& family if present). He/she/they expressed understanding and agree(s) with the plan. Discharge instructions discussed at great length. Strict return precautions discussed and pt &/or family have verbalized understanding of the instructions. No further questions at time of discharge.    New Prescriptions   OXYCODONE-ACETAMINOPHEN (PERCOCET/ROXICET) 5-325 MG TABLET    Take 1 tablet by mouth every 4 (four) hours as needed for severe pain.   VALACYCLOVIR (VALTREX) 1000 MG TABLET    Take 1 tablet (1,000 mg total) by mouth 3 (three) times daily for 10 days.    Follow Up: Lonia Skinner, MD [place] [address] [phone]   with ophthalmology  Shon Baton, [place] [address] [place] DEPT [place] [id number] [address] [place] [phone]        Allan Bacigalupi, Gwenyth Allegra, MD 12/28/21 2151

## 2021-12-28 NOTE — Discharge Instructions (Signed)
Your history and exam today are consistent with shingles on your face that likely triggered your severe headache.  We spoke with ophthalmology who wants to see you on Monday afternoon at 3:15 PM to see Dr. Sherrine Maples with ophthalmology.  Please use the pain medicine and your home nausea medicine to help with symptoms and stay hydrated.  Please take the antiviral medication 3 times a day.  If any symptoms change or worsen or you start having vision change as we discussed, please return to the nearest emergency department

## 2021-12-28 NOTE — ED Triage Notes (Signed)
Pt reports migraine that started yesterday, unrelieved by PRN medications. Pt states pain has now moved into her eyes, R>L. No vision changes, weakness per pt. Pt endorses N/V today.

## 2022-01-13 ENCOUNTER — Ambulatory Visit: Payer: BC Managed Care – PPO

## 2022-02-12 ENCOUNTER — Encounter: Payer: Self-pay | Admitting: Gastroenterology

## 2022-02-12 ENCOUNTER — Ambulatory Visit
Admission: RE | Admit: 2022-02-12 | Discharge: 2022-02-12 | Disposition: A | Discharge status: Home / Self Care | Payer: BC Managed Care – PPO | Source: Ambulatory Visit | Attending: Internal Medicine | Admitting: Internal Medicine

## 2022-02-12 DIAGNOSIS — Z1231 Encounter for screening mammogram for malignant neoplasm of breast: Secondary | ICD-10-CM

## 2022-02-19 ENCOUNTER — Ambulatory Visit (AMBULATORY_SURGERY_CENTER): Payer: BC Managed Care – PPO | Admitting: Gastroenterology

## 2022-02-19 ENCOUNTER — Telehealth: Payer: Self-pay | Admitting: Gastroenterology

## 2022-02-19 ENCOUNTER — Encounter: Payer: Self-pay | Admitting: Gastroenterology

## 2022-02-19 VITALS — BP 105/66 | HR 87 | Temp 97.1°F | Resp 12 | Ht 65.0 in | Wt 143.0 lb

## 2022-02-19 DIAGNOSIS — K449 Diaphragmatic hernia without obstruction or gangrene: Secondary | ICD-10-CM

## 2022-02-19 DIAGNOSIS — K222 Esophageal obstruction: Secondary | ICD-10-CM | POA: Diagnosis not present

## 2022-02-19 DIAGNOSIS — K2100 Gastro-esophageal reflux disease with esophagitis, without bleeding: Secondary | ICD-10-CM | POA: Diagnosis not present

## 2022-02-19 DIAGNOSIS — K317 Polyp of stomach and duodenum: Secondary | ICD-10-CM

## 2022-02-19 DIAGNOSIS — R194 Change in bowel habit: Secondary | ICD-10-CM | POA: Diagnosis not present

## 2022-02-19 MED ORDER — SODIUM CHLORIDE 0.9 % IV SOLN: 500.0000 mL | Freq: Once | INTRAVENOUS | Status: DC

## 2022-02-19 NOTE — Telephone Encounter (Signed)
Called patient back and updated her with Dr. Ardell Isaacs recommendations. Patient verbalized understanding. Patient states that her pain is getting a little better.

## 2022-02-19 NOTE — Patient Instructions (Addendum)
FOLLOW ANTI-REFLUX MEASURES LONG TERM (SEE HANDOUT).    YOU HAD AN ENDOSCOPIC PROCEDURE TODAY AT THE [facility]:   Refer to the procedure report that was given to you for any specific questions about what was found during the examination.  If the procedure report does not answer your questions, please call your gastroenterologist to clarify.  If you requested that your care partner not be given the details of your procedure findings, then the procedure report has been included in a sealed envelope for you to review at your convenience later.  YOU SHOULD EXPECT: Some feelings of bloating in the abdomen. Passage of more gas than usual.  Walking can help get rid of the air that was put into your GI tract during the procedure and reduce the bloating. If you had a lower endoscopy (such as a colonoscopy or flexible sigmoidoscopy) you may notice spotting of blood in your stool or on the toilet paper. If you underwent a bowel prep for your procedure, you may not have a normal bowel movement for a few days.  Please Note:  You might notice some irritation and congestion in your nose or some drainage.  This is from the oxygen used during your procedure.  There is no need for concern and it should clear up in a day or so.  SYMPTOMS TO REPORT IMMEDIATELY:  Following lower endoscopy (colonoscopy or flexible sigmoidoscopy):  Excessive amounts of blood in the stool  Significant tenderness or worsening of abdominal pains  Swelling of the abdomen that is new, acute  Fever of 100F or higher  Following upper endoscopy (EGD)  Vomiting of blood or coffee ground material  New chest pain or pain under the shoulder blades  Painful or persistently difficult swallowing  New shortness of breath  Fever of 100F or higher  Black, tarry-looking stools  For urgent or emergent issues, a gastroenterologist can be reached at any hour by calling [phone]. Do not use MyChart messaging for urgent  concerns.    DIET:  We do recommend a small meal at first, but then you may proceed to your regular diet.  Drink plenty of fluids but you should avoid alcoholic beverages for 24 hours.  ACTIVITY:  You should plan to take it easy for the rest of today and you should NOT DRIVE or use heavy machinery until tomorrow (because of the sedation medicines used during the test).    FOLLOW UP: Our staff will call the number listed on your records the next business day following your procedure.  We will call around 7:15- 8:00 am to check on you and address any questions or concerns that you may have regarding the information given to you following your procedure. If we do not reach you, we will leave a message.  If you develop any symptoms (ie: fever, flu-like symptoms, shortness of breath, cough etc.) before then, please call [phone].  If you test positive for Covid 19 in the 2 weeks post procedure, please call and report this information to [place].    If any biopsies were taken you will be contacted by phone or by letter within the next 1-3 weeks.  Please call us at [phone] if you have not heard about the biopsies in 3 weeks.    SIGNATURES/CONFIDENTIALITY: You and/or your care partner have signed paperwork which will be entered into your electronic medical record.  These signatures attest to the fact that that the information above on your After Visit Summary has been  reviewed and is understood.  Full responsibility of the confidentiality of this discharge information lies with you and/or your care-partner.

## 2022-02-19 NOTE — Telephone Encounter (Signed)
Patient called back with extreme pain in her left upper abdomen. She says it is worse when she takes a deep breath in, it seems to travel up her neck. I advised her to take a gas ex and try some warm fluids. I also gave her some examples of different positions that she can get into to help relieve some gas pains. I also advised her to call back if it does not improve or gets worse.

## 2022-02-19 NOTE — Op Note (Signed)
[facility] Patient Name: Sue Blair Procedure Date: 02/19/2022 8:41 AM MRN: [id number] Endoscopist: Meryl Dare , MD Age: 56 Referring MD:  Date of Birth: 1966/04/16 Gender: Female Account #: [id number] Procedure:                Upper GI endoscopy Indications:              Gastroesophageal reflux disease Medicines:                Monitored Anesthesia Care Procedure:                Pre-Anesthesia Assessment:                           - Prior to the procedure, a History and Physical                            was performed, and patient medications and                            allergies were reviewed. The patient's tolerance of                            previous anesthesia was also reviewed. The risks                            and benefits of the procedure and the sedation                            options and risks were discussed with the patient.                            All questions were answered, and informed consent                            was obtained. Prior Anticoagulants: The patient has                            taken no previous anticoagulant or antiplatelet                            agents. ASA Grade Assessment: II - A patient with                            mild systemic disease. After reviewing the risks                            and benefits, the patient was deemed in                            satisfactory condition to undergo the procedure.                           After obtaining informed consent, the endoscope was  passed under direct vision. Throughout the                            procedure, the patient's blood pressure, pulse, and                            oxygen saturations were monitored continuously. The                            Endoscope was introduced through the mouth, and                            advanced to the second part of duodenum. The upper                            GI endoscopy was  accomplished without difficulty.                            The patient tolerated the procedure well. Scope In: Scope Out: Findings:                 LA Grade A (one or more mucosal breaks less than 5                            mm, not extending between tops of 2 mucosal folds)                            esophagitis with no bleeding was found in the                            distal esophagus.                           A mild Schatzki ring was found in the distal                            esophagus.                           The exam of the esophagus was otherwise normal.                           A medium-sized hiatal hernia was present.                           Multiple 5 to 10 mm pedunculated and sessile polyps                            with no bleeding and no stigmata of recent bleeding                            were found in the gastric fundus and in the gastric  body. Several of the larger polyps were removed                            with a cold snare. Resection and retrieval were                            complete.                           The exam of the stomach was otherwise normal.                           The duodenal bulb and second portion of the                            duodenum were normal. Complications:            No immediate complications. Estimated Blood Loss:     Estimated blood loss was minimal. Impression:               - LA Grade A reflux esophagitis with no bleeding.                           - Mild, non-obstructive Schatzki ring.                           - Medium-sized hiatal hernia.                           - Multiple gastric polyps. Resected and retrieved.                           - Normal duodenal bulb and second portion of the                            duodenum. Recommendation:           - Patient has a contact number available for                            emergencies. The signs and symptoms of potential                             delayed complications were discussed with the                            patient. Return to normal activities tomorrow.                            Written discharge instructions were provided to the                            patient.                           - Resume previous diet.                           -  Follow antireflux measures long term.                           - Continue present medications.                           - Await pathology results. Meryl Dare, MD 02/19/2022 9:38:51 AM This report has been signed electronically.

## 2022-02-19 NOTE — Progress Notes (Signed)
Report given to PACU, vss 

## 2022-02-19 NOTE — Telephone Encounter (Signed)
Please call her back in an hour and check on her. Clear liquids only until pain improves. If significant pain persists to ED for evaluation.

## 2022-02-19 NOTE — Op Note (Signed)
[facility] Patient Name: Sue Blair Procedure Date: 02/19/2022 8:41 AM MRN: [id number] Endoscopist: Meryl Dare , MD Age: 56 Referring MD:  Date of Birth: 09-30-1965 Gender: Female Account #: [id number] Procedure:                Colonoscopy Indications:              Change in bowel habits Medicines:                Monitored Anesthesia Care Procedure:                Pre-Anesthesia Assessment:                           - Prior to the procedure, a History and Physical                            was performed, and patient medications and                            allergies were reviewed. The patient's tolerance of                            previous anesthesia was also reviewed. The risks                            and benefits of the procedure and the sedation                            options and risks were discussed with the patient.                            All questions were answered, and informed consent                            was obtained. Prior Anticoagulants: The patient has                            taken no previous anticoagulant or antiplatelet                            agents. ASA Grade Assessment: II - A patient with                            mild systemic disease. After reviewing the risks                            and benefits, the patient was deemed in                            satisfactory condition to undergo the procedure.                           After obtaining informed consent, the colonoscope  was passed under direct vision. Throughout the                            procedure, the patient's blood pressure, pulse, and                            oxygen saturations were monitored continuously. The                            PCF-HQ190L Colonoscope was introduced through the                            anus and advanced to the the cecum, identified by                            appendiceal orifice and ileocecal valve.  The                            ileocecal valve, appendiceal orifice, and rectum                            were photographed. The quality of the bowel                            preparation was adequate. The colonoscopy was                            somewhat difficult extensive lavage, suction and a                            tortuous colon. The patient tolerated the procedure                            well. Scope In: 8:49:11 AM Scope Out: 9:10:03 AM Scope Withdrawal Time: 0 hours 16 minutes 51 seconds  Total Procedure Duration: 0 hours 20 minutes 52 seconds  Findings:                 The perianal and digital rectal examinations were                            normal.                           The entire examined colon appeared normal on direct                            and retroflexion views. Complications:            No immediate complications. Estimated blood loss:                            None. Estimated Blood Loss:     Estimated blood loss: none. Impression:               - The entire examined colon is normal on direct and  retroflexion views.                           - No specimens collected. Recommendation:           - Repeat colonoscopy in 10 years for screening                            purposes with a more extensive bowel prep.                           - Patient has a contact number available for                            emergencies. The signs and symptoms of potential                            delayed complications were discussed with the                            patient. Return to normal activities tomorrow.                            Written discharge instructions were provided to the                            patient.                           - Resume previous diet.                           - Continue present medications. Meryl Dare, MD 02/19/2022 9:32:11 AM This report has been signed electronically.

## 2022-02-19 NOTE — Progress Notes (Signed)
History & Physical  Primary Care Physician:  Creola Corn, MD Primary Gastroenterologist: Claudette Head, MD  CHIEF COMPLAINT:  Change in bowel habits, GERD  HPI: JUNE RODE is a 56 y.o. female with change in bowel habits and GERD for colonoscopy and EGD.   Past Medical History:  Diagnosis Date   Anxiety    Chronic headaches    Colitis    lymphocytic colitis - resolved   Gastric polyp    GERD (gastroesophageal reflux disease) 06/17/2007   Hiatal hernia 06/17/2007   History of colon polyps    IBS (irritable bowel syndrome)    Migraines    Osteopenia    Osteoporosis    Tennis elbow     Past Surgical History:  Procedure Laterality Date   AUGMENTATION MAMMAPLASTY Bilateral    BREAST ENHANCEMENT SURGERY     CESAREAN SECTION  1998   COLONOSCOPY     LAPAROSCOPIC ASSISTED VAGINAL HYSTERECTOMY  10-2013   POLYPECTOMY      Prior to Admission medications   Medication Sig Start Date End Date Taking? Authorizing Provider  baclofen (LIORESAL) 10 MG tablet Take 10 mg by mouth 3 (three) times daily as needed. 12/06/21  Yes [provider]  buPROPion (WELLBUTRIN XL) 150 MG 24 hr tablet Take 150 mg by mouth every morning. 12/03/21  Yes [provider]  citalopram (CELEXA) 40 MG tablet Take 40 mg by mouth daily.  08/27/13  Yes [provider]  EMGALITY 120 MG/ML SOAJ Inject 1 mL into the skin every 30 (thirty) days. 12/16/21  Yes [provider]  estradiol (VIVELLE-DOT) 0.05 MG/24HR patch Place 1 patch onto the skin 2 (two) times a week.   Yes [provider]  gabapentin (NEURONTIN) 300 MG capsule Take 300 mg by mouth 2 (two) times daily. 01/01/22  Yes [provider]  lamoTRIgine (LAMICTAL) 100 MG tablet Take 200 mg by mouth daily.    Yes [provider]  pantoprazole (PROTONIX) 40 MG tablet Take 1 tablet (40 mg total) by mouth 2 (two) times daily. 12/24/21  Yes Meryl Dare, MD  cyanocobalamin (,VITAMIN B-12,) 1000 MCG/ML  injection Inject 1 mL (1,000 mcg total) into the muscle every 30 (thirty) days. 03/30/15   Armbruster, Willaim Rayas, MD  ibuprofen (ADVIL,MOTRIN) 200 MG tablet Take 600 mg by mouth every 6 (six) hours as needed for headache or moderate pain.    [provider]  oxyCODONE-acetaminophen (PERCOCET/ROXICET) 5-325 MG tablet Take 1 tablet by mouth every 4 (four) hours as needed for severe pain. 12/28/21   Tegeler, Canary Brim, MD  Ubrogepant (UBRELVY) 100 MG TABS Take 100 mg by mouth as needed. Patient not taking: Reported on 02/19/2022 10/22/21   [provider]    Current Outpatient Medications  Medication Sig Dispense Refill   baclofen (LIORESAL) 10 MG tablet Take 10 mg by mouth 3 (three) times daily as needed.     buPROPion (WELLBUTRIN XL) 150 MG 24 hr tablet Take 150 mg by mouth every morning.     citalopram (CELEXA) 40 MG tablet Take 40 mg by mouth daily.      EMGALITY 120 MG/ML SOAJ Inject 1 mL into the skin every 30 (thirty) days.     estradiol (VIVELLE-DOT) 0.05 MG/24HR patch Place 1 patch onto the skin 2 (two) times a week.     gabapentin (NEURONTIN) 300 MG capsule Take 300 mg by mouth 2 (two) times daily.     lamoTRIgine (LAMICTAL) 100 MG tablet Take 200 mg  by mouth daily.      pantoprazole (PROTONIX) 40 MG tablet Take 1 tablet (40 mg total) by mouth 2 (two) times daily. 60 tablet 11   cyanocobalamin (,VITAMIN B-12,) 1000 MCG/ML injection Inject 1 mL (1,000 mcg total) into the muscle every 30 (thirty) days. 12 mL 0   ibuprofen (ADVIL,MOTRIN) 200 MG tablet Take 600 mg by mouth every 6 (six) hours as needed for headache or moderate pain.     oxyCODONE-acetaminophen (PERCOCET/ROXICET) 5-325 MG tablet Take 1 tablet by mouth every 4 (four) hours as needed for severe pain. 15 tablet 0   Ubrogepant (UBRELVY) 100 MG TABS Take 100 mg by mouth as needed. (Patient not taking: Reported on 02/19/2022)     Current Facility-Administered Medications  Medication Dose Route Frequency Provider  Last Rate Last Admin   0.9 %  sodium chloride infusion  500 mL Intravenous Once Meryl Dare, MD        Allergies as of 02/19/2022 - Review Complete 02/19/2022  Allergen Reaction Noted   Clarithromycin Hives 06/14/2009   Sulfonamide derivatives Hives 06/14/2009    Family History  Problem Relation Age of Onset   Breast cancer Mother 75   Breast cancer Maternal Aunt 61   Breast cancer Maternal Grandmother 50   Colon cancer Maternal Uncle    Colon cancer Paternal Uncle    Crohn's disease Paternal Uncle    Diabetes Father    Breast cancer Cousin    Esophageal cancer Neg Hx    Rectal cancer Neg Hx    Stomach cancer Neg Hx     Social History   Socioeconomic History   Marital status: Married    Spouse name: Not on file   Number of children: 2   Years of education: Not on file   Highest education level: Not on file  Occupational History   Occupation: Surveyor, minerals: [employer]  Tobacco Use   Smoking status: Former    Packs/day: 0.50    Years: 5.00    Total pack years: 2.50    Types: Cigarettes    Quit date: 05/22/1985    Years since quitting: 36.7   Smokeless tobacco: Never  Vaping Use   Vaping Use: Never used  Substance and Sexual Activity   Alcohol use: No    Alcohol/week: 0.0 standard drinks of alcohol   Drug use: No   Sexual activity: Yes    Partners: Male  Other Topics Concern   Not on file  Social History Narrative   Not on file   Social Determinants of Health   Financial Resource Strain: Not on file  Food Insecurity: Not on file  Transportation Needs: Not on file  Physical Activity: Not on file  Stress: Not on file  Social Connections: Not on file  Intimate Partner Violence: Not on file    Review of Systems:  All systems reviewed were negative except where noted in HPI.   Physical Exam: General:  Alert, well-developed, in NAD Head:  Normocephalic and atraumatic. Eyes:  Sclera clear, no icterus.   Conjunctiva  pink. Ears:  Normal auditory acuity. Mouth:  No deformity or lesions.  Neck:  Supple; no masses . Lungs:  Clear throughout to auscultation.   No wheezes, crackles, or rhonchi. No acute distress. Heart:  Regular rate and rhythm; no murmurs. Abdomen:  Soft, nondistended, nontender. No masses, hepatomegaly. No obvious masses.  Normal bowel .    Rectal:  Deferred   Msk:  Symmetrical without  gross deformities.. Pulses:  Normal pulses noted. Extremities:  Without edema. Neurologic:  Alert and  oriented x4;  grossly normal neurologically. Skin:  Intact without significant lesions or rashes. Cervical Nodes:  No significant cervical adenopathy. Psych:  Alert and cooperative. Normal mood and affect.   Impression / Plan:   Change in bowel habits and GERD for colonoscopy and EGD.  Venita Lick. Russella Dar  02/19/2022, 8:39 AM See Loretha Stapler, [facility], to contact our on call provider

## 2022-02-19 NOTE — Progress Notes (Signed)
0842 Robinul 0.1 mg IV given due large amount of secretions upon assessment.  MD made aware, vss 

## 2022-02-20 ENCOUNTER — Telehealth: Payer: Self-pay

## 2022-02-20 NOTE — Telephone Encounter (Signed)
  Follow up Call-     02/19/2022    7:58 AM  Call back number  Post procedure Call Back phone  # [phone]     Patient questions:  Do you have a fever, pain , or abdominal swelling? No. Pain Score  0 *  Have you tolerated food without any problems? Yes.    Have you been able to return to your normal activities? Yes.    Do you have any questions about your discharge instructions: Diet   No. Medications  No. Follow up visit  No.  Do you have questions or concerns about your Care? No.  Actions: * If pain score is 4 or above: No action needed, pain <4.

## 2022-02-24 ENCOUNTER — Other Ambulatory Visit: Payer: Self-pay

## 2022-02-24 ENCOUNTER — Other Ambulatory Visit (INDEPENDENT_AMBULATORY_CARE_PROVIDER_SITE_OTHER): Payer: BC Managed Care – PPO

## 2022-02-24 ENCOUNTER — Encounter: Payer: Self-pay | Admitting: Gastroenterology

## 2022-02-24 ENCOUNTER — Telehealth: Payer: Self-pay | Admitting: Gastroenterology

## 2022-02-24 ENCOUNTER — Ambulatory Visit (INDEPENDENT_AMBULATORY_CARE_PROVIDER_SITE_OTHER)
Admission: RE | Admit: 2022-02-24 | Discharge: 2022-02-24 | Disposition: A | Discharge status: Home / Self Care | Payer: BC Managed Care – PPO | Source: Ambulatory Visit | Attending: Gastroenterology | Admitting: Gastroenterology

## 2022-02-24 DIAGNOSIS — M25512 Pain in left shoulder: Secondary | ICD-10-CM

## 2022-02-24 DIAGNOSIS — R1012 Left upper quadrant pain: Secondary | ICD-10-CM

## 2022-02-24 LAB — BASIC METABOLIC PANEL
BUN: 8 mg/dL (ref 6–23)
CO2: 28 mEq/L (ref 19–32)
Calcium: 8.9 mg/dL (ref 8.4–10.5)
Chloride: 103 mEq/L (ref 96–112)
Creatinine, Ser: 0.66 mg/dL (ref 0.40–1.20)
GFR: 97.05 mL/min (ref >60.00)
Glucose, Bld: 85 mg/dL (ref 70–99)
Potassium: 3.3 mEq/L — ABNORMAL LOW (ref 3.5–5.1)
Sodium: 137 mEq/L (ref 135–145)

## 2022-02-24 LAB — CBC WITH DIFFERENTIAL/PLATELET
Basophils Absolute: 0 10*3/uL (ref 0.0–0.1)
Basophils Relative: 0.7 % (ref 0.0–3.0)
Eosinophils Absolute: 0.1 10*3/uL (ref 0.0–0.7)
Eosinophils Relative: 2.8 % (ref 0.0–5.0)
HCT: 33.8 % — ABNORMAL LOW (ref 36.0–46.0)
Hemoglobin: 11.5 g/dL — ABNORMAL LOW (ref 12.0–15.0)
Lymphocytes Relative: 30.6 % (ref 12.0–46.0)
Lymphs Abs: 1.4 10*3/uL (ref 0.7–4.0)
MCHC: 34 g/dL (ref 30.0–36.0)
MCV: 94.1 fl (ref 78.0–100.0)
Monocytes Absolute: 0.5 10*3/uL (ref 0.1–1.0)
Monocytes Relative: 10.8 % (ref 3.0–12.0)
Neutro Abs: 2.5 10*3/uL (ref 1.4–7.7)
Neutrophils Relative %: 55.1 % (ref 43.0–77.0)
Platelets: 272 10*3/uL (ref 150.0–400.0)
RBC: 3.59 Mil/uL — ABNORMAL LOW (ref 3.87–5.11)
RDW: 15.4 % (ref 11.5–15.5)
WBC: 4.6 10*3/uL (ref 4.0–10.5)

## 2022-02-24 NOTE — Telephone Encounter (Signed)
Patient called states she is still having a lot of left abdominal pain and shoulder pain. Requested to speak with a nurse.

## 2022-02-24 NOTE — Telephone Encounter (Signed)
Patient called in with complaints of constant, dull left shoulder pain (8/10) & LUQ abdominal pain with breathing since her endo colon on 02/19/22 with Dr. Russella Dar. She is passing gas, but has not had a bowel movement since procedure. She is holding down food/fluids okay, denies n/v. She has been taking gas X with no relief. Pt seeking further recommendations. Will route to MD.

## 2022-02-24 NOTE — Telephone Encounter (Signed)
Spoke with patient regarding MD recommendations. Labs & 2 view ordered. Patient plans to come by office today.

## 2022-02-25 ENCOUNTER — Other Ambulatory Visit: Payer: Self-pay

## 2022-02-25 DIAGNOSIS — R1012 Left upper quadrant pain: Secondary | ICD-10-CM

## 2022-02-25 DIAGNOSIS — M25512 Pain in left shoulder: Secondary | ICD-10-CM

## 2022-02-25 MED ORDER — DICYCLOMINE HCL 10 MG PO CAPS: 10.0000 mg | ORAL_CAPSULE | Freq: Three times a day (TID) | ORAL | 0 refills | Status: DC | PRN

## 2022-02-27 ENCOUNTER — Ambulatory Visit (HOSPITAL_COMMUNITY): Payer: BC Managed Care – PPO

## 2022-03-19 ENCOUNTER — Other Ambulatory Visit: Payer: Self-pay | Admitting: Gastroenterology

## 2022-09-03 ENCOUNTER — Encounter: Payer: Self-pay | Admitting: Neurology

## 2022-09-03 ENCOUNTER — Ambulatory Visit (INDEPENDENT_AMBULATORY_CARE_PROVIDER_SITE_OTHER): Payer: BC Managed Care – PPO | Admitting: Neurology

## 2022-09-03 VITALS — BP 102/58 | HR 93 | Ht 65.0 in | Wt 144.4 lb

## 2022-09-03 DIAGNOSIS — Z9189 Other specified personal risk factors, not elsewhere classified: Secondary | ICD-10-CM | POA: Insufficient documentation

## 2022-09-03 DIAGNOSIS — R519 Headache, unspecified: Secondary | ICD-10-CM | POA: Diagnosis not present

## 2022-09-03 DIAGNOSIS — G43E11 Chronic migraine with aura, intractable, with status migrainosus: Secondary | ICD-10-CM | POA: Diagnosis not present

## 2022-09-03 DIAGNOSIS — F518 Other sleep disorders not due to a substance or known physiological condition: Secondary | ICD-10-CM | POA: Diagnosis not present

## 2022-09-03 DIAGNOSIS — R0683 Snoring: Secondary | ICD-10-CM | POA: Diagnosis not present

## 2022-09-03 NOTE — Patient Instructions (Signed)
Chronic Migraine Headache A migraine headache is throbbing pain that is usually on one side of the head. Migraines that keep coming back are called recurring migraines. A migraine is called a chronic migraine if it happens at least 15 days in a month for more than 3 months. Talk with your doctor about what things may bring on (trigger) your migraines. What are the causes? The exact cause of this condition is not known. A migraine may be caused when nerves in the brain become irritated and release chemicals that cause irritation and swelling (inflammation) of blood vessels. The irritation and swelling of the blood vessels causes pain. Migraines may be brought on or caused by: Smoking. Foods and drinks, such as: Cheese. Chocolate. Alcohol. Caffeine. Certain substances in some foods or drinks. Some medicines. Other things that may bring on a migraine include: Periods, for women. Stress. Not enough sleep or too much sleep. Feeling very tired. Bright lights or loud noises. Smells Weather changes and being at high altitude. What increases the risk? The following factors may make you more likely to have chronic migraine: Having migraines or family members who have them. Being very sad (depressed) or feeling worried or nervous (anxious). Taking a lot of pain medicine. Having problems sleeping. Having heart disease, diabetes, or being very overweight (obese). What are the signs or symptoms? Symptoms of this condition include: Pain that feels like it throbs. Pain that is usually only on one side of the head. In some cases, the pain may be on both sides of the head or around the head or neck. Very bad pain that keeps you from doing daily activities. Pain that gets worse with activity. Feeling like you may vomit (feeling nauseous) or vomiting. Pain when you are around bright lights, loud noises, or activity. Being sensitive to bright lights, loud noises, or smells. Feeling dizzy. How is  this treated? This condition is treated with: Medicines. These help to: Lessen pain and the feeling like you may vomit. Prevent migraines. Changes to your diet or sleep. Therapy. This might include: Relaxation training. Biofeedback. This is a treatment that teaches you to relax, use your brain to lower your heart rate, and control your breathing. Cognitive behavioral therapy (CBT). This therapy helps you set goals and follow up on the changes that you make. Acupuncture. Using a device that provides electrical stimulation to your nerves, which can help take away pain. Surgery, if the other treatments do not work. Follow these instructions at home: Medicines Take over-the-counter and prescription medicines only as told by your doctor. Ask your doctor if the medicine prescribed to you requires you to avoid driving or using machinery. Lifestyle Do not use any products that contain nicotine or tobacco, such as cigarettes, e-cigarettes, and chewing tobacco. If you need help quitting, ask your doctor. Do not drink alcohol. Get 7-9 hours of sleep each night. Lower the stress in your life. Ask your doctor about ways to do this. Stay at a healthy weight. Talk with your doctor if you need help losing weight. Get regular exercise. General instructions Keep a journal to find out if certain things bring on migraines. For example, write down: What you eat and drink. How much sleep you get. Any change to your diet or medicines. Lie down in a dark, quiet room when you have a migraine. Try placing a cool towel over your head when you have a migraine. Keep lights dim if bright lights bother you or make your migraines worse. Keep all follow-up  visits as told by your doctor. This is important. Where to find more information Coalition for Headache and Migraine Patients (CHAMP): headachemigraine.org American Migraine Foundation: americanmigrainefoundation.org National Headache Foundation:  headaches.org Contact a doctor if: Medicine does not help your migraine. Your pain keeps coming back. Get help right away if: Your migraine becomes really bad and medicine does not help. You have a stiff neck and fever. You have trouble seeing. Your muscles are weak or you lose control of them. You lose your balance or have trouble walking. You feel like you will faint or you faint. You start having sudden, very bad headaches. You have a seizure. Summary A migraine headache is very bad, throbbing pain that is usually on one side of the head. A chronic migraine is a migraine that happens 15 days in a month for more than 3 months. Talk with your doctor about what things may bring on your migraines. Lie down in a dark, quiet room when you have a migraine. Keep a journal. This can help you find out if certain things make you have migraines. This information is not intended to replace advice given to you by your health care provider. Make sure you discuss any questions you have with your health care provider. Document Revised: 01/02/2022 Document Reviewed: 09/07/2019 Elsevier Patient Education  [place] Headache  In a cervicogenic headache, the pain moves from your neck to your head. Most cervicogenic headaches start in the upper part of the neck with the first three cervical bones (cervical vertebrae). What are the causes? The most common cause of this condition is a traumatic injury to the bones and tissues in your neck (cervical spine). Whiplash is an example of a cervical spine injury. Other causes include: Arthritis. Broken bone (fracture). Infection. Tumor. What are the signs or symptoms? The most common symptoms are neck and head pain. The pain is often located on one side. In some cases, there may be head pain without neck pain. Pain may be felt in the neck, back or side of the head, face, or behind the eyes. Other symptoms include: Limited movement in  the neck. Arm or shoulder pain. How is this diagnosed? This condition may be diagnosed based on: Your symptoms. A physical exam. An injection that blocks nerve signals (diagnostic nerve block). Imaging tests, such as: X-rays. CT scan. MRI. A cervicogenic headache is diagnosed when a cause can be found in the cervical spine and other causes of headaches can be ruled out. How is this treated? Treatment for this condition may depend on the underlying condition. Treatment may include: Medicines, such as: NSAIDs, such as ibuprofen. Muscle relaxants. Physical therapy. Massage therapy. Complementary therapies, such as: Biofeedback. Meditation. Acupuncture. Nerve block injections to reduce the pain. Botulinum toxin injections. Your treatment plan may involve working with a pain management team that includes your primary health care provider, a pain management specialist, a neurologist, and a physical therapist. Follow these instructions at home: Take over-the-counter and prescription medicines only as told by your health care provider. Do exercises at home as told by your physical therapist. Return to your normal activities as told by your health care provider. Ask your health care provider what activities are safe for you. Avoid activities that trigger your headaches. Maintain good neck support and posture at home and at work. Keep all follow-up visits. This is important. Contact a health care provider if: You have headaches that are getting worse and happening more often. You have headaches with any  of the following: Fever. Numbness. Weakness. Dizziness. Nausea or vomiting. Get help right away if: You have a sudden and severe headache. This symptom may be an emergency. Get help right away. Call 911. Do not wait to see if this symptom will go away. Do not drive yourself to the hospital. Summary A cervicogenic headache is a headache caused by a condition that affects the bones  and tissues in your cervical spine. Your health care provider may diagnose this condition with a physical exam, a diagnostic nerve block, and imaging tests. Treatment may include medicine to reduce pain and inflammation, physical therapy, and nerve block injections. Complementary therapies, such as acupuncture and meditation, may be added to other treatments. Your treatment plan may involve working with a pain management team that includes your primary health care provider, a pain management specialist, a neurologist, and a physical therapist. This information is not intended to replace advice given to you by your health care provider. Make sure you discuss any questions you have with your health care provider. Document Revised: 01/24/2021 Document Reviewed: 01/24/2021 Elsevier Patient Education  [place].
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
## 2022-09-18 ENCOUNTER — Other Ambulatory Visit: Payer: Self-pay | Admitting: Neurology

## 2022-09-18 ENCOUNTER — Telehealth: Payer: Self-pay | Admitting: Neurology

## 2022-09-18 DIAGNOSIS — R519 Headache, unspecified: Secondary | ICD-10-CM

## 2022-09-18 DIAGNOSIS — F518 Other sleep disorders not due to a substance or known physiological condition: Secondary | ICD-10-CM

## 2022-09-18 DIAGNOSIS — G43E11 Chronic migraine with aura, intractable, with status migrainosus: Secondary | ICD-10-CM

## 2022-09-18 DIAGNOSIS — R0683 Snoring: Secondary | ICD-10-CM

## 2022-09-18 DIAGNOSIS — Z9189 Other specified personal risk factors, not elsewhere classified: Secondary | ICD-10-CM

## 2022-09-18 NOTE — Telephone Encounter (Signed)
Noted, thank you

## 2022-09-18 NOTE — Telephone Encounter (Signed)
Patient left a voicemail on my phone wanting to schedule her SS. I don't see anything ordered yet.

## 2022-09-18 NOTE — Telephone Encounter (Signed)
In Dr Dohmeier previous visit, she did discuss completing sleep study either HST or in lab depending on insurance. Order has been placed.

## 2022-09-22 ENCOUNTER — Encounter: Payer: Self-pay | Admitting: Gastroenterology

## 2022-09-22 ENCOUNTER — Ambulatory Visit: Payer: BC Managed Care – PPO | Admitting: Gastroenterology

## 2022-09-22 VITALS — BP 110/74 | HR 102 | Ht 65.0 in | Wt 146.0 lb

## 2022-09-22 DIAGNOSIS — K5900 Constipation, unspecified: Secondary | ICD-10-CM

## 2022-09-22 DIAGNOSIS — K2100 Gastro-esophageal reflux disease with esophagitis, without bleeding: Secondary | ICD-10-CM

## 2022-09-22 NOTE — Patient Instructions (Addendum)
You can take over the counter Senokot for constipation.   Continue pantoprazole daily.   Follow up with your primary care physician and [place] as needed.   The [facility] providers would like to encourage you to use [facility] to communicate with providers for non-urgent requests or questions.  Due to long hold times on the telephone, sending your provider a message by [facility] may be a faster and more efficient way to get a response.  Please allow 48 business hours for a response.  Please remember that this is for non-urgent requests.   Thank you for choosing me and [facility].  Pricilla Riffle. Dagoberto Ligas., MD., Marval Regal

## 2022-09-22 NOTE — Progress Notes (Signed)
    Assessment     Constipation GERD with LA Grade A esophagitis   Recommendations    Continue Senokot qd or qod and start Gas-X 4 times daily as needed.  If these are not effective she is advised to contact our office Continue pantoprazole 40 mg twice daily and follow antireflux measures Follow up with PCP and REV in 1 year   HPI    This is a 57 year old female here for follow-up of constipation and GERD.  She relates abdominal pain, bloating and constipation that followed her colonoscopy last July.  Blood work and abdominal films were performed and were only remarkable for mild anemia.  It took 8 days for her to have a bowel movement following her colonoscopy.  After her bowels began to move regularly her abdominal pain and bloating resolved.  She has had persistent problems with constipation.  MiraLAX at several doses was not effective or led to diarrhea.  Recently she tried Senokot with good results.  She previously tried stool softeners which were not effective.  She notes increased gas.  Her reflux symptoms are well-controlled on pantoprazole twice daily.  There was pathology letter sent to her that erroneously reported a precancerous polyp was removed however she had no polyps on her colonoscopy and I apologized for the error.  We contacted her about this error shortly after the letter was sent.  We will contact IT to attempt to mark the letter as an error in Epic.  She had questions about her tortuous colon noted on colonoscopy and we reviewed this finding.  Colonoscopy July 2023 for change in bowel habits: normal   EGD July 2023: LA Grade A esophagitis, medium-sized HH, gastric fundic polyps    Labs / Imaging        No data to display             Latest Ref Rng & Units 02/24/2022    3:42 PM 03/04/2019   10:35 AM 09/18/2016    6:42 AM  CBC  WBC 4.0 - 10.5 K/uL 4.6  3.3    Hemoglobin 12.0 - 15.0 g/dL 11.5  13.9  13.6   Hematocrit 36.0 - 46.0 % 33.8  41.9  40.0    Platelets 150.0 - 400.0 K/uL 272.0  251      Current Medications, Allergies, Past Medical History, Past Surgical History, Family History and Social History were reviewed in [employer] record.   Physical Exam: General: Well developed, well nourished, no acute distress Head: Normocephalic and atraumatic Eyes: Sclerae anicteric, EOMI Ears: Normal auditory acuity Mouth: No deformities or lesions noted Lungs: Clear throughout to auscultation Heart: Regular rate and rhythm; No murmurs, rubs or bruits Abdomen: Soft, non tender and non distended. No masses, hepatosplenomegaly or hernias noted. Normal Bowel sounds Rectal: Not done Musculoskeletal: Symmetrical with no gross deformities  Pulses:  Normal pulses noted Extremities: No edema or deformities noted Neurological: Alert oriented x 4, grossly nonfocal Psychological:  Alert and cooperative. Normal mood and affect   Aymar Whitfill T. Fuller Plan, MD 09/22/2022, 9:51 AM

## 2022-10-08 ENCOUNTER — Ambulatory Visit (INDEPENDENT_AMBULATORY_CARE_PROVIDER_SITE_OTHER): Payer: BC Managed Care – PPO | Admitting: Neurology

## 2022-10-08 ENCOUNTER — Other Ambulatory Visit: Payer: Self-pay | Admitting: Neurology

## 2022-10-08 ENCOUNTER — Encounter: Payer: Self-pay | Admitting: Neurology

## 2022-10-08 ENCOUNTER — Other Ambulatory Visit (HOSPITAL_COMMUNITY): Payer: Self-pay

## 2022-10-08 VITALS — BP 111/73 | HR 71 | Ht 65.5 in | Wt 145.0 lb

## 2022-10-08 DIAGNOSIS — G43709 Chronic migraine without aura, not intractable, without status migrainosus: Secondary | ICD-10-CM

## 2022-10-08 MED ORDER — BACLOFEN 10 MG PO TABS: 10.0000 mg | ORAL_TABLET | Freq: Three times a day (TID) | ORAL | 11 refills | Status: DC | PRN

## 2022-10-08 MED ORDER — HYDROXYZINE HCL 25 MG PO TABS: 25.0000 mg | ORAL_TABLET | ORAL | 11 refills | Status: DC | PRN

## 2022-10-08 MED ORDER — ONDANSETRON HCL 4 MG PO TABS: 4.0000 mg | ORAL_TABLET | Freq: Three times a day (TID) | ORAL | 11 refills | Status: DC | PRN

## 2022-10-08 MED ORDER — EMGALITY 120 MG/ML ~~LOC~~ SOAJ: 1.0000 mL | SUBCUTANEOUS | 11 refills | Status: DC

## 2022-10-08 MED ORDER — EMGALITY 120 MG/ML ~~LOC~~ SOAJ
1.0000 mL | SUBCUTANEOUS | 11 refills | Status: DC
  Filled 2022-10-08 – 2022-10-09 (×2): qty 1, 30d supply, fill #0
  Filled 2022-11-17: qty 1, 30d supply, fill #1
  Filled 2023-09-25: qty 1, 30d supply, fill #2
  Filled 2023-09-29: qty 1, 30d supply, fill #3

## 2022-10-08 NOTE — Progress Notes (Signed)
[id number]    Provider:  Dr Jaynee Eagles Requesting Provider: Preston Fleeting, FNP Primary Care Provider:  Shon Baton, MD  CC:  Migraines  HPI:  Sue Blair is a 57 y.o. female here as requested by Preston Fleeting, FNP for migraines.  I reviewed Sue Blair's notes, patient has significantly improved on Emgality, greater than 50% improvement in migraine frequency and headache frequency and severity, she currently has 6 headaches days per month that can last up to 1 to 2 days but the intensity again has diminished significantly.  She continues treatment for TMJ with PT and is followed by dentistry.  She also continues a lot of Knechtel for her mood which has been stable.  She is stable on current medications, Zomig nasal spray helps within 30 minutes, [name] is also effective and she is pleased, Zofran for nausea unless she cannot tolerate oral then she takes Phenergan.  Patient is here for transfer of care, she has migraines which can be pulsating pounding throbbing, they can be unilateral, photophobia phonophobia, nausea, vomiting, hurts to move, a dark room helps, she has been doing very well on Emgality and Zomig. Saw Dr. Brett Fairy and getting a sleep evaluation. Has been having trouble getting emgality. She was able to get a syringe version. Prior to emgality she was having > 15 migraine days a month and most of the month headaches since then 5 migraine days a month. Cheese can triger a migraine, sleep helps, strong smells can trigger, weather can affect migraines as well. No aura. No medication overuse. The last time she took emgality has been months maybe 3 months ago. Has been struggling with migraines since the age of 54. No other focal neurologic deficits, associated symptoms, inciting events or modifiable factors.  Reviewed notes, labs and imaging from outside physicians, which showed: I thoroughly reviewed Sue Blair's notes and patient's notes in epic and care  everywhere, the following are list of medications tried by patient for headaches/migraines:   Current and past medications: ANALGESICS:bc, darvocet, fioricet ANTI-MIGRAINE:imitrex spray, imitrex injection (SE), maxalt, zomig nasal spray HEART/BP:  blood pressure medications contraindicated due to  DECONGESTANT/ANTIHISTAMINE:allegra, benadryl, dramamine, flonase, sudafed, zyrtec ANTI-NAUSEANT:compazine, reglan, phenergan, zofran NSAIDS:ibuprofen, naproxen MUSCLE RELAXANTS: baclofen ANTI-CONVULSANTS:lamictal, topamax STEROIDS:hydrocortisone SLEEPING PILLS/TRANQUILIZERS:xanax ANTI-DEPRESSANTS:celexa, effexor, paxil, amitriptyline HERBAL: migrelief FIBROMYALGIA:  HORMONAL:mircette OTHER: Emgality, aimovig contraindicated due to constipation, ubrelvy PROCEDURES FOR HEADACHES:   Mri brain: CLINICAL DATA:  Focal neuro deficit, greater than 6 hours, stroke suspected. Additional history: Headache, numbness bilateral lower extremities yesterday.   EXAM: MRI HEAD WITHOUT CONTRAST   TECHNIQUE: Multiplanar, multiecho pulse sequences of the brain and surrounding structures were obtained without intravenous contrast.   COMPARISON:  Head CT 03/04/2019, brain MRI 08/29/2005.   FINDINGS: Brain: No restricted diffusion is demonstrated to suggest acute or recent subacute infarction. No evidence of intracranial mass or intracranial hemorrhage. No midline shift or extra-axial collection. Mild scattered T2/FLAIR hyperintensity within the cerebral white matter and brainstem is consistent with chronic small vessel ischemic disease and slightly progressed as compared to prior MRI 08/29/2005. Redemonstrated small chronic lacunar infarct within the left cerebellum. Symmetric T2 hyperintensity within the globus pallidus bilaterally is similar to prior MRI, and may reflect sequela of remote insult. Cerebral volume is age appropriate.   Vascular: Flow voids maintained within the proximal large  vessels.   Skull and upper cervical spine: Normal marrow signal. Reversal of the expected cervical lordosis.   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 Small  air-fluid levels within the bilateral sphenoid sinuses. No significant mastoid effusion.   IMPRESSION: No evidence of acute intracranial abnormality.   Mild chronic small vessel ischemic disease slightly progressed from MRI 08/29/2005. 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   Unchanged symmetric signal changes within the globus pallidus bilaterally, possibly reflecting sequela of remote insult.   Small bilateral sphenoid sinus air-fluid levels. Correlate for acute sinusitis.   Review of Systems: Patient complains of symptoms per HPI as well as the following symptoms migraines. Pertinent negatives and positives per HPI. All others negative.   Social History   Socioeconomic History   Marital status: Married    Spouse name: Not on file   Number of children: 2   Years of education: Not on file   Highest education level: Not on file  Occupational History   Occupation: Glass blower/designer    Employer: [employer]  Tobacco Use   Smoking status: Former    Packs/day: 0.50    Years: 5.00    Total pack years: 2.50    Types: Cigarettes    Quit date: 05/22/1985    Years since quitting: 37.4   Smokeless tobacco: Never  Vaping Use   Vaping Use: Never used  Substance and Sexual Activity   Alcohol use: No    Comment: maybe once or twice a year   Drug use: No   Sexual activity: Yes    Partners: Male  Other Topics Concern   Not on file  Social History Narrative   Caffeine: heavy    Social Determinants of Health   Financial Resource Strain: Not on file  Food Insecurity: Not on file  Transportation Needs: Not on file  Physical Activity: Not on file  Stress: Not on file  Social Connections: Not on file  Intimate Partner Violence: Not on file     Family History  Problem Relation Age of Onset   Migraines Mother    Breast cancer Mother 47   Diabetes Father    Breast cancer Maternal Grandmother 59   Breast cancer Maternal Aunt 61   Colon cancer Maternal Uncle    Colon cancer Paternal Uncle    Crohn's disease Paternal Uncle    Breast cancer Cousin    Migraines Son    Migraines Daughter    Esophageal cancer Neg Hx    Rectal cancer Neg Hx    Stomach cancer Neg Hx     Past Medical History:  Diagnosis Date   Anxiety    Chronic headaches    Colitis    lymphocytic colitis - resolved   Gastric polyp    GERD (gastroesophageal reflux disease) 06/17/2007   Hiatal hernia 06/17/2007   History of colon polyps    IBS (irritable bowel syndrome)    Migraines    Osteopenia    Osteoporosis    Tennis elbow     Patient Active Problem List   Diagnosis Date Noted   Chronic migraine without aura without status migrainosus, not intractable 10/08/2022   Abnormal dreams 09/03/2022   Intractable chronic migraine with aura with status migrainosus 09/03/2022   Sleep related headaches 09/03/2022   At risk for obstructive sleep apnea 09/03/2022   Loud snoring 09/03/2022   Paresthesias 01/29/2015   Constipation 01/04/2012   Internal hemorrhoids 01/04/2012   ANEMIA, B12 DEFICIENCY 05/29/2008   ANXIETY 08/20/2007   COLITIS 08/20/2007   IRRITABLE BOWEL SYNDROME 08/20/2007   HEMOCCULT POSITIVE STOOL 08/20/2007   OSTEOPENIA 08/20/2007   ALLERGY 08/20/2007  Past Surgical History:  Procedure Laterality Date   AUGMENTATION MAMMAPLASTY Bilateral    BREAST ENHANCEMENT SURGERY     CESAREAN SECTION  1998   COLONOSCOPY     LAPAROSCOPIC ASSISTED VAGINAL HYSTERECTOMY  10-2013   POLYPECTOMY      Current Outpatient Medications  Medication Sig Dispense Refill   buPROPion (WELLBUTRIN XL) 150 MG 24 hr tablet Take 150 mg by mouth every morning.     citalopram (CELEXA) 40 MG tablet Take 40 mg by mouth daily.      cyanocobalamin (,VITAMIN  B-12,) 1000 MCG/ML injection Inject 1 mL (1,000 mcg total) into the muscle every 30 (thirty) days. 12 mL 0   estradiol (ESTRACE) 0.1 MG/GM vaginal cream INSERT 0.5 G TWICE A WEEK BY VAGINAL ROUTE AT BEDTIME     estradiol (VIVELLE-DOT) 0.1 MG/24HR patch Place 1 patch onto the skin 2 (two) times a week.     fluorometholone (FML) 0.1 % ophthalmic suspension Place 1 drop into the right eye 2 (two) times daily.     gabapentin (NEURONTIN) 300 MG capsule Take 300 mg by mouth 2 (two) times daily.     lamoTRIgine (LAMICTAL) 100 MG tablet Take 200 mg by mouth daily.      pantoprazole (PROTONIX) 40 MG tablet Take 1 tablet (40 mg total) by mouth 2 (two) times daily. 60 tablet 11   Ubrogepant (UBRELVY) 100 MG TABS Take 100 mg by mouth as needed.     valACYclovir (VALTREX) 1000 MG tablet Take 1 tablet by mouth daily.     baclofen (LIORESAL) 10 MG tablet Take 1 tablet (10 mg total) by mouth 3 (three) times daily as needed. 30 each 11   EMGALITY 120 MG/ML SOAJ Inject 1 mL into the skin every 30 (thirty) days. 1 mL 11   hydrOXYzine (ATARAX) 25 MG tablet Take 1 tablet (25 mg total) by mouth every 4 (four) hours as needed. 30 tablet 11   ondansetron (ZOFRAN) 4 MG tablet Take 1 tablet (4 mg total) by mouth every 8 (eight) hours as needed. 20 tablet 11   No current facility-administered medications for this visit.    Allergies as of 10/08/2022 - Review Complete 10/08/2022  Allergen Reaction Noted   Metoclopramide Anxiety 10/12/2017   Clarithromycin Hives 06/14/2009   Sulfonamide derivatives Hives 06/14/2009    Vitals: BP 111/73 (BP Location: Left Arm, Patient Position: Sitting)   Pulse 71   Ht 5' 5.5" (1.664 m)   Wt 145 lb (65.8 kg)   LMP 04/14/2011   BMI 23.76 kg/m  Last Weight:  Wt Readings from Last 1 Encounters:  10/08/22 145 lb (65.8 kg)   Last Height:   Ht Readings from Last 1 Encounters:  10/08/22 5' 5.5" (1.664 m)     Physical exam: Exam: Gen: NAD, conversant, well nourised, well  groomed                     CV: RRR, no MRG. No Carotid Bruits. No peripheral edema, warm, nontender Eyes: Conjunctivae clear without exudates or hemorrhage  Neuro: Detailed Neurologic Exam  Speech:    Speech is normal; fluent and spontaneous with normal comprehension.  Cognition:    The patient is oriented to person, place, and time;     recent and remote memory intact;     language fluent;     normal attention, concentration,     fund of knowledge Cranial Nerves:    The pupils are equal, round, and reactive to light. The  fundi are normal and spontaneous venous pulsations are present. Visual fields are full to finger confrontation. Extraocular movements are intact. Trigeminal sensation is intact and the muscles of mastication are normal. The face is symmetric. The palate elevates in the midline. Hearing intact. Voice is normal. Shoulder shrug is normal. The tongue has normal motion without fasciculations.   Coordination: nml  Gait: nml  Motor Observation:    No asymmetry, no atrophy, and no involuntary movements noted. Tone:    Normal muscle tone.    Posture:    Posture is normal. normal erect    Strength:    Strength is V/V in the upper and lower limbs.      Sensation: intact to LT     Reflex Exam:  DTR's:    Deep tendon reflexes in the upper and lower extremities are normal bilaterally.   Toes:    The toes are downgoing bilaterally.   Clonus:    Clonus is absent.    Assessment/Plan: I reviewed Sue Blair's assessment and plan, and I agree with the following for patient with chronic migraine without aura without status migrainosus not intractable. Patient had 15 migraine days a month and daily headaches prior to emality, after emgality maybe 4-5 migraine days a month and < 10 total headache days a month.   - hasn't been able to get emgality due to shortage [name] and/or hydroxyzine or Zomig acutely -Ondansetron or Phenergan for nausea -Continue baclofen 10 mg  as needed - [facility], [facility] and [facility] will send there, showed patient how to reorder vis mychart   Meds ordered this encounter  Medications   DISCONTD: EMGALITY 120 MG/ML SOAJ    Sig: Inject 1 mL into the skin every 30 (thirty) days.    Dispense:  1.12 mL    Refill:  11   baclofen (LIORESAL) 10 MG tablet    Sig: Take 1 tablet (10 mg total) by mouth 3 (three) times daily as needed.    Dispense:  30 each    Refill:  11   hydrOXYzine (ATARAX) 25 MG tablet    Sig: Take 1 tablet (25 mg total) by mouth every 4 (four) hours as needed.    Dispense:  30 tablet    Refill:  11   ondansetron (ZOFRAN) 4 MG tablet    Sig: Take 1 tablet (4 mg total) by mouth every 8 (eight) hours as needed.    Dispense:  20 tablet    Refill:  11   EMGALITY 120 MG/ML SOAJ    Sig: Inject 1 mL into the skin every 30 (thirty) days.    Dispense:  1 mL    Refill:  11    Cc: Preston Fleeting, FNP,  Shon Baton, MD  Sarina Ill, MD  [facility] [address] [place], [place] [address]  Phone [phone] Fax [phone]  I spent over 45 minutes of face-to-face and non-face-to-face time with patient on the  1. Chronic migraine without aura without status migrainosus, not intractable    diagnosis.  This included previsit chart review, lab review, study review, order entry, electronic health record documentation, patient education on the different diagnostic and therapeutic options, counseling and coordination of care, risks and benefits of management, compliance, or risk factor reduction

## 2022-10-08 NOTE — Patient Instructions (Signed)
- hasn't been able to get emgality due to shortage Sue Blair and/or hydroxyzine or Zomig acutely -Ondansetron or Phenergan for nausea -Continue baclofen 10 mg as needed  Galcanezumab Injection What is this medication? GALCANEZUMAB (gal ka NEZ ue mab) prevents migraines. It works by blocking a substance in the body that causes migraines. It may also be used to treat cluster headaches. It is a monoclonal antibody. This medicine may be used for other purposes; ask your health care provider or pharmacist if you have questions. COMMON BRAND NAME(S): Emgality What should I tell my care team before I take this medication? They need to know if you have any of these conditions: An unusual or allergic reaction to galcanezumab, other medications, foods, dyes, or preservatives Pregnant or trying to get pregnant Breast-feeding How should I use this medication? This medication is injected under the skin. You will be taught how to prepare and give it. Take it as directed on the prescription label. Keep taking it unless your care team tells you to stop. It is important that you put your used needles and syringes in a special sharps container. Do not put them in a trash can. If you do not have a sharps container, call your pharmacist or care team to get one. Talk to your care team about the use of this medication in children. Special care may be needed. Overdosage: If you think you have taken too much of this medicine contact a poison control center or emergency room at once. NOTE: This medicine is only for you. Do not share this medicine with others. What if I miss a dose? If you miss a dose, take it as soon as you can. If it is almost time for your next dose, take only that dose. Do not take double or extra doses. What may interact with this medication? Interactions are not expected. This list may not describe all possible interactions. Give your health care provider a list of all the medicines, herbs,  non-prescription drugs, or dietary supplements you use. Also tell them if you smoke, drink alcohol, or use illegal drugs. Some items may interact with your medicine. What should I watch for while using this medication? Visit your care team for regular checks on your progress. Tell your care team if your symptoms do not start to get better or if they get worse. What side effects may I notice from receiving this medication? Side effects that you should report to your care team as soon as possible: Allergic reactions or angioedema--skin rash, itching or hives, swelling of the face, eyes, lips, tongue, arms, or legs, trouble swallowing or breathing Side effects that usually do not require medical attention (report to your care team if they continue or are bothersome): Pain, redness, or irritation at injection site This list may not describe all possible side effects. Call your doctor for medical advice about side effects. You may report side effects to FDA at 1-800-FDA-1088. Where should I keep my medication? Keep out of the reach of children and pets. Store in a refrigerator or at room temperature between 20 and 25 degrees C (68 and 77 degrees F). Refrigeration (preferred): Store in the refrigerator. Do not freeze. Keep in the original container until you are ready to take it. Remove the dose from the carton about 30 minutes before it is time for you to use it. If the dose is not used, it may be stored in original container at room temperature for 7 days. Get rid of any unused  medication after the expiration date. Room Temperature: This medication may be stored at room temperature for up to 7 days. Keep it in the original container. Protect from light until time of use. If it is stored at room temperature, get rid of any unused medication after 7 days or after it expires, whichever is first. To get rid of medications that are no longer needed or have expired: Take the medication to a medication take-back  program. Check with your pharmacy or law enforcement to find a location. If you cannot return the medication, ask your pharmacist or care team how to get rid of this medication safely. NOTE: This sheet is a summary. It may not cover all possible information. If you have questions about this medicine, talk to your doctor, pharmacist, or health care provider.  2023 Elsevier/Gold Standard (2021-09-10 00:00:00)

## 2022-10-09 ENCOUNTER — Other Ambulatory Visit: Payer: Self-pay | Admitting: *Deleted

## 2022-10-09 ENCOUNTER — Other Ambulatory Visit (HOSPITAL_COMMUNITY): Payer: Self-pay

## 2022-10-09 ENCOUNTER — Other Ambulatory Visit: Payer: Self-pay | Admitting: Neurology

## 2022-10-09 ENCOUNTER — Telehealth: Payer: Self-pay | Admitting: *Deleted

## 2022-10-09 DIAGNOSIS — G43709 Chronic migraine without aura, not intractable, without status migrainosus: Secondary | ICD-10-CM

## 2022-10-09 MED ORDER — HYDROXYZINE PAMOATE 25 MG PO CAPS: 25.0000 mg | ORAL_CAPSULE | Freq: Four times a day (QID) | ORAL | 11 refills | Status: DC | PRN

## 2022-10-09 NOTE — Telephone Encounter (Signed)
Completed Emgality PA on CMM. Key: [id number]. Awaiting determination from [facility].

## 2022-10-21 ENCOUNTER — Other Ambulatory Visit (HOSPITAL_COMMUNITY): Payer: Self-pay

## 2022-10-29 ENCOUNTER — Ambulatory Visit: Payer: BC Managed Care – PPO | Admitting: Neurology

## 2022-10-29 DIAGNOSIS — G4733 Obstructive sleep apnea (adult) (pediatric): Secondary | ICD-10-CM

## 2022-10-29 DIAGNOSIS — R519 Headache, unspecified: Secondary | ICD-10-CM

## 2022-10-29 DIAGNOSIS — R0683 Snoring: Secondary | ICD-10-CM

## 2022-10-29 DIAGNOSIS — F518 Other sleep disorders not due to a substance or known physiological condition: Secondary | ICD-10-CM

## 2022-10-29 DIAGNOSIS — Z9189 Other specified personal risk factors, not elsewhere classified: Secondary | ICD-10-CM

## 2022-10-29 DIAGNOSIS — G43E11 Chronic migraine with aura, intractable, with status migrainosus: Secondary | ICD-10-CM

## 2022-10-30 NOTE — Progress Notes (Signed)
[facility] TEST REPORT ( by [facility] PAT)   STUDY DATE:  11-01-2022 DOB:  1966-01-31 MRN:  [id number]    ORDERING CLINICIAN:  [name], MD  REFERRING CLINICIAN:  Dr Virgina Jock   CLINICAL INFORMATION/HISTORY: 09/03/2022 from Dr Virgina Jock  for a sleep consult. .  Chief concern according to patient :  "I went on Emgality and my migraines have changed, less intense and less frequently associated with nausea, I don't vomit as often, I need a quiet environment. Sometimes I use an ice cap and take zofran." [name] has helped",  " I had ZOMIG and it gave me palpitations, and I don't like triptans.    I have the pleasure of seeing Sue Blair today, a right -handed White or Caucasian female with a possible sleep disorder.  She wakes with morning headaches and TMJ  has a past medical history of Anxiety, Chronic headaches, Colitis, Gastric polyp, GERD (gastroesophageal reflux disease) (06/17/2007), Hiatal hernia (06/17/2007), History of colon polyps, IBS (irritable bowel syndrome), Migraines, Osteopenia, Osteoporosis, and Tennis elbow.     Epworth sleepiness score:8 /24. FSS at 18/ 24   BMI:  24 kg/m   Neck Circumference: 14"   FINDINGS:   Sleep Summary:   Total Recording Time (hours, min): 9 hours 20 minutes        Total Sleep Time (hours, min):    8 hours 27 minutes             Percent REM (%):   25%                                     Respiratory Indices:   Calculated pAHI (per hour):    18/h                         REM pAHI:    17.2/h                                             NREM pAHI:   18.2/h                           Positional AHI:     The patient slept for the vast majority of the night on her back associated with an AHI of 19.4/h and an RDI of 22.8/h.  Right lateral sleep was associated with an AHI of 6.3/h.  Snoring level reached a mean volume of 41 dB and was present for about 13% of total sleep time.                                               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   RECOMMENDATION: I would recommend to avoid the supine sleep position rather than starting a CPAP therapy for this patient. She may investigate the tennis Majchrzak method, or a [employer] made device of a chest encompassing belt with a positional sensor that  we will warn her when she resumes supine sleep.  I doubt that her morning headaches are associated with such rather mild hypoxemia and snoring , but could be related to apnea.    INTERPRETING PHYSICIAN:   [name], MD   Medical Director of [facility]

## 2022-11-06 NOTE — Procedures (Signed)
[facility] TEST REPORT ( by Watch PAT)   STUDY DATE:  11-01-2022 DOB:  29-Apr-1966 MRN:  [id number]    ORDERING CLINICIAN:  [name], MD  REFERRING CLINICIAN:  Dr Virgina Jock   CLINICAL INFORMATION/HISTORY: 09/03/2022 from Dr Virgina Jock  for a sleep consult. .  Chief concern according to patient :  "I went on Emgality and my migraines have changed, less intense and less frequently associated with nausea, I don't vomit as often, I need a quiet environment. Sometimes I use an ice cap and take zofran." [name] has helped",  " I had ZOMIG and it gave me palpitations, and I don't like triptans.    I have the pleasure of seeing Sue Blair today, a right -handed White or Caucasian female with a possible sleep disorder.  She wakes with morning headaches and TMJ  has a past medical history of Anxiety, Chronic headaches, Colitis, Gastric polyp, GERD (gastroesophageal reflux disease) (06/17/2007), Hiatal hernia (06/17/2007), History of colon polyps, IBS (irritable bowel syndrome), Migraines, Osteopenia, Osteoporosis, and Tennis elbow.     Epworth sleepiness score:8 /24. FSS at 18/ 24   BMI:  24 kg/m   Neck Circumference: 14"   FINDINGS:   Sleep Summary:   Total Recording Time (hours, min): 9 hours 20 minutes        Total Sleep Time (hours, min):    8 hours 27 minutes             Percent REM (%):   25%                                     Respiratory Indices:   Calculated pAHI (per hour):    18/h                         REM pAHI:    17.2/h                                             NREM pAHI:   18.2/h                           Positional AHI:     The patient slept for the vast majority of the night on her back associated with an AHI of 19.4/h and an RDI of 22.8/h.  Right lateral sleep was associated with an AHI of 6.3/h.  Snoring level reached a mean volume of 41 dB and was present for about 13% of total sleep time.                                               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   RECOMMENDATION: I would recommend to avoid the supine sleep position rather than starting a CPAP therapy for this patient. She may investigate the tennis Fraticelli method, or a [employer] made device of a chest encompassing belt with a positional sensor that  we will warn her when she resumes supine sleep.  I doubt that her morning headaches are associated with such rather mild hypoxemia and snoring , but could be related to apnea.    INTERPRETING PHYSICIAN:   [name], MD   Medical Director of [facility] at [facility]

## 2022-11-10 ENCOUNTER — Telehealth: Payer: Self-pay | Admitting: Neurology

## 2022-11-10 NOTE — Telephone Encounter (Signed)
Called the pt and reviewed the SSR in detail with the patient. Advised that Dr Vickey Huger states that if she can avoid sleping on her back and focus on sleeping on her right side the apnea is mild enough where CPAP therapy is not needed. Advised if she feels she is unable to do this, CPAP would be recommended to help treat the moderate sleep apnea. Pt would like to try sleeping on her side. Reviewed the tips she could try in helping her with sleeping on her side. Pt verbalized understanding. Pt had no questions at this time but was encouraged to call back if questions arise.

## 2022-11-10 NOTE — Telephone Encounter (Signed)
-----   Message from Melvyn Novas, MD sent at 11/06/2022  6:05 PM EDT ----- Patient should be offered  positional therapy  before CPAP.

## 2022-12-29 ENCOUNTER — Other Ambulatory Visit: Payer: Self-pay | Admitting: Gastroenterology

## 2023-01-07 ENCOUNTER — Ambulatory Visit: Payer: BC Managed Care – PPO | Admitting: Neurology

## 2023-06-29 ENCOUNTER — Other Ambulatory Visit: Payer: Self-pay | Admitting: Gastroenterology

## 2023-07-23 ENCOUNTER — Other Ambulatory Visit: Payer: Self-pay | Admitting: Internal Medicine

## 2023-07-23 DIAGNOSIS — Z1231 Encounter for screening mammogram for malignant neoplasm of breast: Secondary | ICD-10-CM

## 2023-08-18 ENCOUNTER — Ambulatory Visit: Payer: 59

## 2023-09-10 ENCOUNTER — Ambulatory Visit
Admission: RE | Admit: 2023-09-10 | Discharge: 2023-09-10 | Disposition: A | Discharge status: Home / Self Care | Payer: 59 | Source: Ambulatory Visit | Attending: Internal Medicine | Admitting: Internal Medicine

## 2023-09-10 ENCOUNTER — Other Ambulatory Visit: Payer: Self-pay | Admitting: Internal Medicine

## 2023-09-10 DIAGNOSIS — Z1231 Encounter for screening mammogram for malignant neoplasm of breast: Secondary | ICD-10-CM

## 2023-09-16 ENCOUNTER — Other Ambulatory Visit: Payer: Self-pay | Admitting: Internal Medicine

## 2023-09-16 DIAGNOSIS — R928 Other abnormal and inconclusive findings on diagnostic imaging of breast: Secondary | ICD-10-CM

## 2023-09-18 ENCOUNTER — Other Ambulatory Visit: Payer: Self-pay | Admitting: Internal Medicine

## 2023-09-18 ENCOUNTER — Ambulatory Visit
Admission: RE | Admit: 2023-09-18 | Discharge: 2023-09-18 | Disposition: A | Discharge status: Home / Self Care | Payer: 59 | Source: Ambulatory Visit | Attending: Internal Medicine | Admitting: Internal Medicine

## 2023-09-18 DIAGNOSIS — R928 Other abnormal and inconclusive findings on diagnostic imaging of breast: Secondary | ICD-10-CM

## 2023-09-18 DIAGNOSIS — R921 Mammographic calcification found on diagnostic imaging of breast: Secondary | ICD-10-CM

## 2023-09-28 ENCOUNTER — Ambulatory Visit
Admission: RE | Admit: 2023-09-28 | Discharge: 2023-09-28 | Disposition: A | Discharge status: Home / Self Care | Payer: 59 | Source: Ambulatory Visit | Attending: Internal Medicine | Admitting: Internal Medicine

## 2023-09-28 ENCOUNTER — Other Ambulatory Visit (HOSPITAL_COMMUNITY): Payer: Self-pay | Admitting: Diagnostic Radiology

## 2023-09-28 ENCOUNTER — Other Ambulatory Visit: Payer: Self-pay

## 2023-09-28 DIAGNOSIS — R921 Mammographic calcification found on diagnostic imaging of breast: Secondary | ICD-10-CM

## 2023-09-28 HISTORY — PX: BREAST BIOPSY: SHX20

## 2023-09-29 ENCOUNTER — Other Ambulatory Visit: Payer: Self-pay | Admitting: Internal Medicine

## 2023-09-29 ENCOUNTER — Other Ambulatory Visit (HOSPITAL_COMMUNITY): Payer: Self-pay

## 2023-09-29 DIAGNOSIS — N6310 Unspecified lump in the right breast, unspecified quadrant: Secondary | ICD-10-CM

## 2023-09-29 LAB — SURGICAL PATHOLOGY

## 2023-09-30 ENCOUNTER — Other Ambulatory Visit: Payer: Self-pay

## 2023-10-13 ENCOUNTER — Encounter: Payer: Self-pay | Admitting: Adult Health

## 2023-10-13 ENCOUNTER — Telehealth (INDEPENDENT_AMBULATORY_CARE_PROVIDER_SITE_OTHER): Payer: 59 | Admitting: Adult Health

## 2023-10-13 DIAGNOSIS — G43E11 Chronic migraine with aura, intractable, with status migrainosus: Secondary | ICD-10-CM | POA: Diagnosis not present

## 2023-10-13 DIAGNOSIS — G43709 Chronic migraine without aura, not intractable, without status migrainosus: Secondary | ICD-10-CM

## 2023-10-13 MED ORDER — ONDANSETRON 4 MG PO TBDP: 4.0000 mg | ORAL_TABLET | Freq: Three times a day (TID) | ORAL | 0 refills | Status: AC | PRN

## 2023-10-13 MED ORDER — BACLOFEN 10 MG PO TABS: 10.0000 mg | ORAL_TABLET | Freq: Three times a day (TID) | ORAL | 11 refills | Status: DC | PRN

## 2023-10-13 MED ORDER — EMGALITY 120 MG/ML ~~LOC~~ SOAJ: 1.0000 mL | SUBCUTANEOUS | 11 refills | Status: DC

## 2023-10-13 MED ORDER — UBRELVY 100 MG PO TABS: ORAL_TABLET | ORAL | 11 refills | Status: DC

## 2023-10-13 NOTE — Progress Notes (Signed)
 PATIENT: Sue Blair DOB: Sep 20, 1965  REASON FOR VISIT: follow up HISTORY FROM: patient PRIMARY NEUROLOGIST: Dr. Lucia Gaskins   Virtual Visit via Video Note  I connected with Sue Blair on 10/13/23 at  2:30 PM EDT by a video enabled telemedicine application located remotely at [facility] and verified that I am speaking with the correct person using two identifiers who was located at their own home.   I discussed the limitations of evaluation and management by telemedicine and the availability of in person appointments. The patient expressed understanding and agreed to proceed.     HISTORY OF PRESENT ILLNESS: Today 10/13/23  Sue Blair is a 58 y.o. female who has been followed in this office for Migraine. Returns today for follow-up.  She states that she Went two months without emgality due to insurance- she had no headaches. So she stayed off of it for 10 months with no headaches. On 08/18/23 she had the worst headache. Had an aura. She has nausea and vomiting. Lasted 9 hours. Was unable to keep meds down. She did try Zomig nasal spray but it didn't help. Zofran helped nausea and then she was able to try vistaril, baclofen and ubrelvy. The headache didn't completely go away until 2 days later.   She then decided to take emgality. She got a headache 2 weeks later on 09/07/23. No vomiting or aura. She took ubrlevy, baclofen and hydroxyzine.  Slept 2-3 hours. Felt better. Then 3 weeks later 10/01/23 woke with migraine. Took the same thing. It helped. The next day had another migraine. Repeated medication. Now having about 10 mild headaches a month.  She states that this is better than having severe headaches.  Reports that she has a lot of anxiety around having another severe headache.  So she would rather stay on Emgality since that has been the only thing that has helped the severity of her migraines in the past.   HISTORY  Sue Blair is a 58 y.o. female here as requested  by [place] Ouch, FNP for migraines.  I reviewed [name]'s notes, patient has significantly improved on Emgality, greater than 50% improvement in migraine frequency and headache frequency and severity, she currently has 6 headaches days per month that can last up to 1 to 2 days but the intensity again has diminished significantly.  She continues treatment for TMJ with PT and is followed by dentistry.  She also continues a lot of Knechtel for her mood which has been stable.  She is stable on current medications, Zomig nasal spray helps within 30 minutes, Bernita Raisin is also effective and she is pleased, Zofran for nausea unless she cannot tolerate oral then she takes Phenergan.   Patient is here for transfer of care, she has migraines which can be pulsating pounding throbbing, they can be unilateral, photophobia phonophobia, nausea, vomiting, hurts to move, a dark room helps, she has been doing very well on Emgality and Zomig. Saw Dr. Vickey Huger and getting a sleep evaluation. Has been having trouble getting emgality. She was able to get a syringe version. Prior to emgality she was having > 15 migraine days a month and most of the month headaches since then 5 migraine days a month. Cheese can triger a migraine, sleep helps, strong smells can trigger, weather can affect migraines as well. No aura. No medication overuse. The last time she took emgality has been months maybe 3 months ago. Has been struggling with migraines since the age  of 18. No other focal neurologic deficits, associated symptoms, inciting events or modifiable factors.   Reviewed notes, labs and imaging from outside physicians, which showed: I thoroughly reviewed [name]'s notes and patient's notes in epic and care everywhere, the following are list of medications tried by patient for headaches/migraines:   Current and past medications: ANALGESICS:bc, darvocet, fioricet ANTI-MIGRAINE:imitrex spray, imitrex injection (SE), maxalt, zomig  nasal spray HEART/BP:  blood pressure medications contraindicated due to  DECONGESTANT/ANTIHISTAMINE:allegra, benadryl, dramamine, flonase, sudafed, zyrtec ANTI-NAUSEANT:compazine, reglan, phenergan, zofran NSAIDS:ibuprofen, naproxen MUSCLE RELAXANTS: baclofen ANTI-CONVULSANTS:lamictal, topamax STEROIDS:hydrocortisone SLEEPING PILLS/TRANQUILIZERS:xanax ANTI-DEPRESSANTS:celexa, effexor, paxil, amitriptyline HERBAL: migrelief FIBROMYALGIA:  HORMONAL:mircette OTHER: Emgality, aimovig contraindicated due to constipation, ubrelvy PROCEDURES FOR HEADACHES:    Mri brain: CLINICAL DATA:  Focal neuro deficit, greater than 6 hours, stroke suspected. Additional history: Headache, numbness bilateral lower extremities yesterday.   EXAM: MRI HEAD WITHOUT CONTRAST   TECHNIQUE: Multiplanar, multiecho pulse sequences of the brain and surrounding structures were obtained without intravenous contrast.   COMPARISON:  Head CT 03/04/2019, brain MRI 08/29/2005.   FINDINGS: Brain: No restricted diffusion is demonstrated to suggest acute or recent subacute infarction. No evidence of intracranial mass or intracranial hemorrhage. No midline shift or extra-axial collection. Mild scattered T2/FLAIR hyperintensity within the cerebral white matter and brainstem is consistent with chronic small vessel ischemic disease and slightly progressed as compared to prior MRI 08/29/2005. Redemonstrated small chronic lacunar infarct within the left cerebellum. Symmetric T2 hyperintensity within the globus pallidus bilaterally is similar to prior MRI, and may reflect sequela of remote insult. Cerebral volume is age appropriate.   Vascular: Flow voids maintained within the proximal large vessels.   Skull and upper cervical spine: Normal marrow signal. Reversal of the expected cervical lordosis.   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 Small air-fluid levels within the  bilateral sphenoid sinuses. No significant mastoid effusion.   IMPRESSION: No evidence of acute intracranial abnormality.   Mild chronic small vessel ischemic disease slightly progressed from MRI 08/29/2005. 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   Unchanged symmetric signal changes within the globus pallidus bilaterally, possibly reflecting sequela of remote insult.   Small bilateral sphenoid sinus air-fluid levels. Correlate for acute sinusitis.  REVIEW OF SYSTEMS: Out of a complete 14 system review of symptoms, the patient complains only of the following symptoms, and all other reviewed systems are negative.  ALLERGIES: Allergies  Allergen Reactions   Metoclopramide Anxiety    Other Reaction(s): Agitation   Clarithromycin Hives   Sulfonamide Derivatives Hives    HOME MEDICATIONS: Outpatient Medications Prior to Visit  Medication Sig Dispense Refill   hydrOXYzine (VISTARIL) 25 MG capsule Take 1 capsule (25 mg total) by mouth every 6 (six) hours as needed. 30 capsule 11   baclofen (LIORESAL) 10 MG tablet Take 1 tablet (10 mg total) by mouth 3 (three) times daily as needed. 30 each 11   buPROPion (WELLBUTRIN XL) 150 MG 24 hr tablet Take 150 mg by mouth every morning.     citalopram (CELEXA) 40 MG tablet Take 40 mg by mouth daily.      cyanocobalamin (,VITAMIN B-12,) 1000 MCG/ML injection Inject 1 mL (1,000 mcg total) into the muscle every 30 (thirty) days. 12 mL 0   EMGALITY 120 MG/ML SOAJ Inject 1 mL into the skin every 30 (thirty) days. 1 mL 11   estradiol (ESTRACE) 0.1 MG/GM vaginal cream INSERT 0.5 G TWICE A WEEK BY VAGINAL ROUTE AT BEDTIME     estradiol (VIVELLE-DOT)  0.1 MG/24HR patch Place 1 patch onto the skin 2 (two) times a week.     fluorometholone (FML) 0.1 % ophthalmic suspension Place 1 drop into the right eye 2 (two) times daily.     gabapentin (NEURONTIN) 300 MG capsule Take 300 mg by mouth 2 (two) times daily.     lamoTRIgine (LAMICTAL)  100 MG tablet Take 200 mg by mouth daily.      ondansetron (ZOFRAN) 4 MG tablet Take 1 tablet (4 mg total) by mouth every 8 (eight) hours as needed. 20 tablet 11   pantoprazole (PROTONIX) 40 MG tablet TAKE 1 TABLET BY MOUTH TWICE A DAY 180 tablet 2   Ubrogepant (UBRELVY) 100 MG TABS Take 100 mg by mouth as needed.     valACYclovir (VALTREX) 1000 MG tablet Take 1 tablet by mouth daily.     No facility-administered medications prior to visit.    PAST MEDICAL HISTORY: Past Medical History:  Diagnosis Date   Anxiety    Chronic headaches    Colitis    lymphocytic colitis - resolved   Gastric polyp    GERD (gastroesophageal reflux disease) 06/17/2007   Hiatal hernia 06/17/2007   History of colon polyps    IBS (irritable bowel syndrome)    Migraines    Osteopenia    Osteoporosis    Tennis elbow     PAST SURGICAL HISTORY: Past Surgical History:  Procedure Laterality Date   AUGMENTATION MAMMAPLASTY Bilateral    BREAST BIOPSY Right 09/28/2023   MM RT BREAST BX W LOC DEV 1ST LESION IMAGE BX SPEC STEREO GUIDE 09/28/2023 [facility]   BREAST ENHANCEMENT SURGERY     CESAREAN SECTION  1998   COLONOSCOPY     LAPAROSCOPIC ASSISTED VAGINAL HYSTERECTOMY  10-2013   POLYPECTOMY      FAMILY HISTORY: Family History  Problem Relation Age of Onset   Migraines Mother    Breast cancer Mother 76   Diabetes Father    Breast cancer Maternal Grandmother 52   Breast cancer Maternal Aunt 61   Colon cancer Maternal Uncle    Colon cancer Paternal Uncle    Crohn's disease Paternal Uncle    Breast cancer Cousin    Migraines Son    Migraines Daughter    Esophageal cancer Neg Hx    Rectal cancer Neg Hx    Stomach cancer Neg Hx     SOCIAL HISTORY: Social History   Socioeconomic History   Marital status: Married    Spouse name: Not on file   Number of children: 2   Years of education: Not on file   Highest education level: Not on file  Occupational History   Occupation: Surveyor, mining: [employer]  Tobacco Use   Smoking status: Former    Current packs/day: 0.00    Average packs/day: 0.5 packs/day for 5.0 years (2.5 ttl pk-yrs)    Types: Cigarettes    Start date: 05/22/1980    Quit date: 05/22/1985    Years since quitting: 38.4   Smokeless tobacco: Never  Vaping Use   Vaping status: Never Used  Substance and Sexual Activity   Alcohol use: No    Comment: maybe once or twice a year   Drug use: No   Sexual activity: Yes    Partners: Male  Other Topics Concern   Not on file  Social History Narrative   Caffeine: heavy    Social Drivers of Corporate investment banker Strain: Not  on file  Food Insecurity: No Food Insecurity (09/17/2021)   Received from [facility], [facility]   Hunger Vital Sign    Worried About Running Out of Food in the Last Year: Never true    Ran Out of Food in the Last Year: Never true  Transportation Needs: Not on file  Physical Activity: Not on file  Stress: Not on file  Social Connections: Unknown (12/06/2021)   [facility] from [facility], [facility]   Social Network    Social Network: Not on file  Intimate Partner Violence: Unknown (11/07/2021)   Received from [facility], [facility]   HITS    Physically Hurt: Not on file    Insult or Talk Down To: Not on file    Threaten Physical Harm: Not on file    Scream or Curse: Not on file      PHYSICAL EXAM   Generalized: Well developed, in no acute distress   Neurological examination  Mentation: Alert oriented to time, place, history taking. Follows all commands speech and language fluent Cranial nerve II-XII: Facial symmetry noted  DIAGNOSTIC DATA (LABS, IMAGING, TESTING) - I reviewed patient records, labs, notes, testing and imaging myself where available.  Lab Results  Component Value Date   WBC 4.6 02/24/2022   HGB 11.5 (L) 02/24/2022   HCT 33.8 (L) 02/24/2022   MCV 94.1 02/24/2022   PLT 272.0 02/24/2022      Component Value  Date/Time   NA 137 02/24/2022 1542   K 3.3 (L) 02/24/2022 1542   CL 103 02/24/2022 1542   CO2 28 02/24/2022 1542   GLUCOSE 85 02/24/2022 1542   BUN 8 02/24/2022 1542   CREATININE 0.66 02/24/2022 1542   CALCIUM 8.9 02/24/2022 1542   GFRNONAA >60 03/04/2019 1035   GFRAA >60 03/04/2019 1035   No results found for: "CHOL", "HDL", "LDLCALC", "LDLDIRECT", "TRIG", "CHOLHDL" No results found for: "HGBA1C" Lab Results  Component Value Date   VITAMINB12 436 12/27/2015   No results found for: "TSH"    ASSESSMENT AND PLAN 58 y.o. year old female  has a past medical history of Anxiety, Chronic headaches, Colitis, Gastric polyp, GERD (gastroesophageal reflux disease) (06/17/2007), Hiatal hernia (06/17/2007), History of colon polyps, IBS (irritable bowel syndrome), Migraines, Osteopenia, Osteoporosis, and Tennis elbow. here with:  1.  Migraine headaches  -We discussed potentially trying another medication such as Qulipta.  However the patient has a lot of anxiety about having another severe migraine.  She would rather continue on Emgality monthly injection for now. -Continue hydroxyzine 25 mg for migraine every 6 hours as needed -Continue baclofen 10 mg up to 3 times a day as needed for migraine --Continue Ubrelvy for abortive therapy.  Take 1 tablet at the onset of a migraine repeat in 2 hours if needed.  Only 2 tablets in 24 hours -Continue Zofran for nausea -Follow-up in 2 to 3 months or sooner if needed       Butch Penny, MSN, NP-C 10/13/2023, 2:18 PM [facility] [address], [address] [place], [place] [address] [phone]

## 2023-10-13 NOTE — Patient Instructions (Addendum)
-  Continue hydroxyzine 25 mg for migraine every 6 hours as needed -Continue baclofen 10 mg up to 3 times a day as needed for migraine -Continue Zofran for nausea -Continue Ubrelvy for abortive therapy.  Take 1 tablet at the onset of a migraine repeat in 2 hours if needed.  Only 2 tablets in 24 hours -Continue Emgality injections -In the future we can consider switching you to [employer]

## 2023-10-14 MED ORDER — HYDROXYZINE PAMOATE 25 MG PO CAPS: 25.0000 mg | ORAL_CAPSULE | Freq: Four times a day (QID) | ORAL | 11 refills | Status: DC | PRN

## 2023-10-16 ENCOUNTER — Telehealth: Payer: Self-pay

## 2023-10-16 ENCOUNTER — Other Ambulatory Visit (HOSPITAL_COMMUNITY): Payer: Self-pay

## 2023-10-16 NOTE — Telephone Encounter (Signed)
 Pharmacy Patient Advocate Encounter   Received notification from CoverMyMeds that prior authorization for Ubrelvy 100MG  tablets is required/requested.   Insurance verification completed.   The patient is insured through [employer] .   Per test claim: PA required; PA submitted to above mentioned insurance via CoverMyMeds Key/confirmation #/EOC [id number] Status is pending

## 2023-10-16 NOTE — Telephone Encounter (Signed)
 Pharmacy Patient Advocate Encounter   Received notification from CoverMyMeds that prior authorization for Emgality 120MG /ML auto-injectors (migraine) is required/requested.   Insurance verification completed.   The patient is insured through [employer] .   Per test claim: PA required; PA submitted to above mentioned insurance via CoverMyMeds Key/confirmation #/EOC [id number] Status is pending

## 2023-10-19 NOTE — Telephone Encounter (Signed)
 Pharmacy Patient Advocate Encounter  Received notification from [employer] that Prior Authorization for Emgality 120MG /ML auto-injectors (migraine)  has been APPROVED from 10/16/2023 to 01/16/2024   PA #/Case ID/Reference #: [id number]

## 2023-10-19 NOTE — Telephone Encounter (Signed)
 Pharmacy Patient Advocate Encounter  Received notification from [facility] that Prior Authorization for Ubrelvy 100MG  tablets  has been APPROVED from 10/16/2023 to 10/15/2024   PA #/Case ID/Reference #: [id number]

## 2023-12-17 ENCOUNTER — Telehealth: Payer: Self-pay | Admitting: Pharmacist

## 2023-12-17 ENCOUNTER — Other Ambulatory Visit (HOSPITAL_COMMUNITY): Payer: Self-pay

## 2023-12-17 NOTE — Telephone Encounter (Signed)
 Pharmacy Patient Advocate Encounter   Received notification from CoverMyMeds that prior authorization for Emgality  120MG /ML auto-injectors (migraine) is required/requested.   Insurance verification completed.   The patient is insured through [employer] .   Per test claim: The current 30 day co-pay is, $30.00.  No PA needed at this time. This test claim was processed through [facility]- copay amounts may vary at other pharmacies due to pharmacy/plan contracts, or as the patient moves through the different stages of their insurance plan.

## 2024-01-18 ENCOUNTER — Telehealth: Payer: Self-pay | Admitting: Pharmacist

## 2024-01-18 NOTE — Telephone Encounter (Signed)
 Pharmacy Patient Advocate Encounter   Received notification from CoverMyMeds that prior authorization for Emgality  120MG /ML auto-injectors (migraine) is required/requested.   Insurance verification completed.   The patient is insured through [facility] .   Per test claim: PA required; PA submitted to above mentioned insurance via CoverMyMeds Key/confirmation #/EOC [id number] Status is pending

## 2024-01-18 NOTE — Telephone Encounter (Signed)
 Pharmacy Patient Advocate Encounter  Received notification from [facility] that Prior Authorization for Emgality  120MG /ML auto-injectors (migraine) has been APPROVED from 01/18/2024 to 01/17/2025   PA #/Case ID/Reference #: N/A

## 2024-01-19 ENCOUNTER — Telehealth: Admitting: Adult Health

## 2024-01-19 DIAGNOSIS — G43709 Chronic migraine without aura, not intractable, without status migrainosus: Secondary | ICD-10-CM

## 2024-01-19 NOTE — Progress Notes (Signed)
 PATIENT: Sue Blair DOB: [birth year]  REASON FOR VISIT: follow up HISTORY FROM: patient PRIMARY NEUROLOGIST: Dr. Tresia Fruit   Virtual Visit via Video Note  I connected with Flint Hummer on 01/19/24 at 11:30 AM EDT by a video enabled telemedicine application located remotely at Home office and verified that I am speaking with the correct person using two identifiers who was located at their own home in [place]   I discussed the limitations of evaluation and management by telemedicine and the availability of in person appointments. The patient expressed understanding and agreed to proceed.     HISTORY OF PRESENT ILLNESS: Today 01/19/24:  Sue Blair is a 58 y.o. female with a history of migraines headaches. Returns today for follow-up.  Overall she feels that she is doing well.  Emgality  seems to be working to keep her from having severe migraines.  She states that she has approximately 5 headaches a month.  These headaches will typically resolve with Ubrelvy .  On occasion she may have to use the baclofen .  She does have hydroxyzine  but reports that she has not had to use that.  Overall she has been doing well.  She returns today for an evaluation.  10/13/23: Sue Blair is a 58 y.o. female who has been followed in this office for Migraine. Returns today for follow-up.  She states that she Went two months without emgality  due to insurance- she had no headaches. So she stayed off of it for 10 months with no headaches. On 08/18/23 she had the worst headache. Had an aura. She has nausea and vomiting. Lasted 9 hours. Was unable to keep meds down. She did try Zomig nasal spray but it didn't help. Zofran  helped nausea and then she was able to try vistaril , baclofen  and ubrelvy . The headache didn't completely go away until 2 days later.   She then decided to take emgality . She got a headache 2 weeks later on 09/07/23. No vomiting or aura. She took ubrlevy, baclofen  and hydroxyzine .  Slept 2-3 hours. Felt  better. Then 3 weeks later 10/01/23 woke with migraine. Took the same thing. It helped. The next day had another migraine. Repeated medication. Now having about 10 mild headaches a month.  She states that this is better than having severe headaches.  Reports that she has a lot of anxiety around having another severe headache.  So she would rather stay on Emgality  since that has been the only thing that has helped the severity of her migraines in the past.   HISTORY  Sue Blair is a 58 y.o. female here as requested by Esaw Heckler, FNP for migraines.  I reviewed Sue Blair's notes, patient has significantly improved on Emgality , greater than 50% improvement in migraine frequency and headache frequency and severity, she currently has 6 headaches days per month that can last up to 1 to 2 days but the intensity again has diminished significantly.  She continues treatment for TMJ with PT and is followed by dentistry.  She also continues a lot of Knechtel for her mood which has been stable.  She is stable on current medications, Zomig nasal spray helps within 30 minutes, Ubrelvy  is also effective and she is pleased, Zofran  for nausea unless she cannot tolerate oral then she takes Phenergan.   Patient is here for transfer of care, she has migraines which can be pulsating pounding throbbing, they can be unilateral, photophobia phonophobia, nausea, vomiting, hurts to move, a dark room helps,  she has been doing very well on Emgality  and Zomig. Saw Dr. Albertina Hugger and getting a sleep evaluation. Has been having trouble getting emgality . She was able to get a syringe version. Prior to emgality  she was having > 15 migraine days a month and most of the month headaches since then 5 migraine days a month. Cheese can triger a migraine, sleep helps, strong smells can trigger, weather can affect migraines as well. No aura. No medication overuse. The last time she took emgality  has been months maybe 3 months ago. Has been  struggling with migraines since the age of 24. No other focal neurologic deficits, associated symptoms, inciting events or modifiable factors.   Reviewed notes, labs and imaging from outside physicians, which showed: I thoroughly reviewed Sue Blair's notes and patient's notes in epic and care everywhere, the following are list of medications tried by patient for headaches/migraines:   Current and past medications: ANALGESICS:bc, darvocet, fioricet ANTI-MIGRAINE:imitrex spray, imitrex injection (SE), maxalt, zomig nasal spray HEART/BP:  blood pressure medications contraindicated due to  DECONGESTANT/ANTIHISTAMINE:allegra, benadryl , dramamine, flonase, sudafed, zyrtec ANTI-NAUSEANT:compazine , reglan , phenergan, zofran  NSAIDS:ibuprofen, naproxen MUSCLE RELAXANTS: baclofen  ANTI-CONVULSANTS:lamictal, topamax STEROIDS:hydrocortisone  SLEEPING PILLS/TRANQUILIZERS:xanax ANTI-DEPRESSANTS:celexa, effexor, paxil, amitriptyline HERBAL: migrelief FIBROMYALGIA:  HORMONAL:mircette OTHER: Emgality , aimovig contraindicated due to constipation, ubrelvy  PROCEDURES FOR HEADACHES:    Mri brain: CLINICAL DATA:  Focal neuro deficit, greater than 6 hours, stroke suspected. Additional history: Headache, numbness bilateral lower extremities yesterday.   EXAM: MRI HEAD WITHOUT CONTRAST   TECHNIQUE: Multiplanar, multiecho pulse sequences of the brain and surrounding structures were obtained without intravenous contrast.   COMPARISON:  Head CT 03/04/2019, brain MRI 08/29/2005.   FINDINGS: Brain: No restricted diffusion is demonstrated to suggest acute or recent subacute infarction. No evidence of intracranial mass or intracranial hemorrhage. No midline shift or extra-axial collection. Mild scattered T2/FLAIR hyperintensity within the cerebral white matter and brainstem is consistent with chronic small vessel ischemic disease and slightly progressed as compared to prior MRI 08/29/2005. Redemonstrated  small chronic lacunar infarct within the left cerebellum. Symmetric T2 hyperintensity within the globus pallidus bilaterally is similar to prior MRI, and may reflect sequela of remote insult. Cerebral volume is age appropriate.   Vascular: Flow voids maintained within the proximal large vessels.   Skull and upper cervical spine: Normal marrow signal. Reversal of the expected cervical lordosis.   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 Small air-fluid levels within the bilateral sphenoid sinuses. No significant mastoid effusion.   IMPRESSION: No evidence of acute intracranial abnormality.   Mild chronic small vessel ischemic disease slightly progressed from MRI 08/29/2005. 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   Unchanged symmetric signal changes within the globus pallidus bilaterally, possibly reflecting sequela of remote insult.   Small bilateral sphenoid sinus air-fluid levels. Correlate for acute sinusitis.  REVIEW OF SYSTEMS: Out of a complete 14 system review of symptoms, the patient complains only of the following symptoms, and all other reviewed systems are negative.  ALLERGIES: Allergies  Allergen Reactions   Metoclopramide  Anxiety    Other Reaction(s): Agitation   Clarithromycin Hives   Sulfonamide Derivatives Hives    HOME MEDICATIONS: Outpatient Medications Prior to Visit  Medication Sig Dispense Refill   baclofen  (LIORESAL ) 10 MG tablet Take 1 tablet (10 mg total) by mouth 3 (three) times daily as needed (For migraine). 30 each 11   buPROPion (WELLBUTRIN XL) 150 MG 24 hr tablet Take 150 mg by mouth every morning.     citalopram (CELEXA) 40 MG  tablet Take 40 mg by mouth daily.      cyanocobalamin  (,VITAMIN B-12,) 1000 MCG/ML injection Inject 1 mL (1,000 mcg total) into the muscle every 30 (thirty) days. 12 mL 0   EMGALITY  120 MG/ML SOAJ Inject 1 mL into the skin every 30 (thirty) days. 1 mL 11    estradiol (ESTRACE) 0.1 MG/GM vaginal cream INSERT 0.5 G TWICE A WEEK BY VAGINAL ROUTE AT BEDTIME     estradiol (VIVELLE-DOT) 0.1 MG/24HR patch Place 1 patch onto the skin 2 (two) times a week.     fluorometholone (FML) 0.1 % ophthalmic suspension Place 1 drop into the right eye 2 (two) times daily.     gabapentin (NEURONTIN) 300 MG capsule Take 300 mg by mouth 2 (two) times daily.     hydrOXYzine  (VISTARIL ) 25 MG capsule Take 1 capsule (25 mg total) by mouth every 6 (six) hours as needed. 30 capsule 11   lamoTRIgine (LAMICTAL) 100 MG tablet Take 200 mg by mouth daily.      ondansetron  (ZOFRAN -ODT) 4 MG disintegrating tablet Take 1 tablet (4 mg total) by mouth every 8 (eight) hours as needed for nausea or vomiting. 20 tablet 0   pantoprazole  (PROTONIX ) 40 MG tablet TAKE 1 TABLET BY MOUTH TWICE A DAY 180 tablet 2   Ubrogepant  (UBRELVY ) 100 MG TABS Take 1 tablet at the onset of a migraine.  Can repeat in 2 hours if needed.  Only 2 tablets in 24 hours 15 tablet 11   valACYclovir  (VALTREX ) 1000 MG tablet Take 1 tablet by mouth daily.     No facility-administered medications prior to visit.    PAST MEDICAL HISTORY: Past Medical History:  Diagnosis Date   Anxiety    Chronic headaches    Colitis    lymphocytic colitis - resolved   Gastric polyp    GERD (gastroesophageal reflux disease) 06/17/2007   Hiatal hernia 06/17/2007   History of colon polyps    IBS (irritable bowel syndrome)    Migraines    Osteopenia    Osteoporosis    Tennis elbow     PAST SURGICAL HISTORY: Past Surgical History:  Procedure Laterality Date   AUGMENTATION MAMMAPLASTY Bilateral    BREAST BIOPSY Right 09/28/2023   MM RT BREAST BX W LOC DEV 1ST LESION IMAGE BX SPEC STEREO GUIDE 09/28/2023 [facility]   BREAST ENHANCEMENT SURGERY     CESAREAN SECTION  1998   COLONOSCOPY     LAPAROSCOPIC ASSISTED VAGINAL HYSTERECTOMY  10-2013   POLYPECTOMY      FAMILY HISTORY: Family History  Problem Relation Age of  Onset   Migraines Mother    Breast cancer Mother 67   Diabetes Father    Breast cancer Maternal Grandmother 37   Breast cancer Maternal Aunt 61   Colon cancer Maternal Uncle    Colon cancer Paternal Uncle    Crohn's disease Paternal Uncle    Breast cancer Cousin    Migraines Son    Migraines Daughter    Esophageal cancer Neg Hx    Rectal cancer Neg Hx    Stomach cancer Neg Hx     SOCIAL HISTORY: Social History   Socioeconomic History   Marital status: Married    Spouse name: Not on file   Number of children: 2   Years of education: Not on file   Highest education level: Not on file  Occupational History   Occupation: Surveyor, minerals: [employer]  Tobacco Use  Smoking status: Former    Current packs/day: 0.00    Average packs/day: 0.5 packs/day for 5.0 years (2.5 ttl pk-yrs)    Types: Cigarettes    Start date: 05/22/1980    Quit date: 05/22/1985    Years since quitting: 38.6   Smokeless tobacco: Never  Vaping Use   Vaping status: Never Used  Substance and Sexual Activity   Alcohol use: No    Comment: maybe once or twice a year   Drug use: No   Sexual activity: Yes    Partners: Male  Other Topics Concern   Not on file  Social History Narrative   Caffeine : heavy    Social Drivers of Corporate investment banker Strain: Not on file  Food Insecurity: No Food Insecurity (09/17/2021)   Received from [facility]   Hunger Vital Sign    Within the past 12 months, you worried that your food would run out before you got the money to buy more.: Never true    Within the past 12 months, the food you bought just didn't last and you didn't have money to get more.: Never true  Transportation Needs: Not on file  Physical Activity: Not on file  Stress: Not on file  Social Connections: Unknown (12/06/2021)   [facility]   Social Network    Social Network: Not on file  Intimate Partner Violence: Unknown (11/07/2021)   Received from  [facility]   HITS    Physically Hurt: Not on file    Insult or Talk Down To: Not on file    Threaten Physical Harm: Not on file    Scream or Curse: Not on file      PHYSICAL EXAM   Generalized: Well developed, in no acute distress   Neurological examination  Mentation: Alert oriented to time, place, history taking. Follows all commands speech and language fluent Cranial nerve II-XII: Facial symmetry noted  DIAGNOSTIC DATA (LABS, IMAGING, TESTING) - I reviewed patient records, labs, notes, testing and imaging myself where available.  Lab Results  Component Value Date   WBC 4.6 02/24/2022   HGB 11.5 (L) 02/24/2022   HCT 33.8 (L) 02/24/2022   MCV 94.1 02/24/2022   PLT 272.0 02/24/2022      Component Value Date/Time   NA 137 02/24/2022 1542   K 3.3 (L) 02/24/2022 1542   CL 103 02/24/2022 1542   CO2 28 02/24/2022 1542   GLUCOSE 85 02/24/2022 1542   BUN 8 02/24/2022 1542   CREATININE 0.66 02/24/2022 1542   CALCIUM 8.9 02/24/2022 1542   GFRNONAA >60 03/04/2019 1035   GFRAA >60 03/04/2019 1035   No results found for: CHOL, HDL, LDLCALC, LDLDIRECT, TRIG, CHOLHDL No results found for: UJWJ1B Lab Results  Component Value Date   VITAMINB12 436 12/27/2015   No results found for: TSH    ASSESSMENT AND PLAN 58 y.o. year old female  has a past medical history of Anxiety, Chronic headaches, Colitis, Gastric polyp, GERD (gastroesophageal reflux disease) (06/17/2007), Hiatal hernia (06/17/2007), History of colon polyps, IBS (irritable bowel syndrome), Migraines, Osteopenia, Osteoporosis, and Tennis elbow. here with:  1.  Migraine headaches  - Continue Emgality  monthly injection  -Continue hydroxyzine  25 mg for migraine every 6 hours as needed -Continue baclofen  10 mg up to 3 times a day as needed for migraine --Continue Ubrelvy  for abortive therapy.  Take 1 tablet at the onset of a migraine repeat in 2 hours if needed.  Only 2 tablets in 24  hours -Continue Zofran  for nausea -Follow-up in 6 months or sooner if needed       Clem Currier, MSN, NP-C 01/19/2024, 11:31 AM [facility] [address], [address] [place], [place] [address] [phone]  The patient's condition requires frequent monitoring and adjustments in the treatment plan, reflecting the ongoing complexity of care.  This provider is the continuing focal point for all needed services for this condition.

## 2024-01-19 NOTE — Patient Instructions (Signed)
-   Continue Emgality  monthly injection  -Continue hydroxyzine  25 mg for migraine every 6 hours as needed -Continue baclofen  10 mg up to 3 times a day as needed for migraine --Continue Ubrelvy  for abortive therapy.  Take 1 tablet at the onset of a migraine repeat in 2 hours if needed.  Only 2 tablets in 24 hours -Continue Zofran  for nausea

## 2024-03-18 ENCOUNTER — Other Ambulatory Visit: Payer: Self-pay | Admitting: Internal Medicine

## 2024-03-18 ENCOUNTER — Ambulatory Visit
Admission: RE | Admit: 2024-03-18 | Discharge: 2024-03-18 | Disposition: A | Discharge status: Home / Self Care | Payer: 59 | Source: Ambulatory Visit | Attending: Internal Medicine | Admitting: Internal Medicine

## 2024-03-18 DIAGNOSIS — N6310 Unspecified lump in the right breast, unspecified quadrant: Secondary | ICD-10-CM

## 2024-08-23 ENCOUNTER — Telehealth: Admitting: Adult Health

## 2024-08-23 DIAGNOSIS — G43709 Chronic migraine without aura, not intractable, without status migrainosus: Secondary | ICD-10-CM

## 2024-08-23 MED ORDER — BACLOFEN 10 MG PO TABS: 10.0000 mg | ORAL_TABLET | Freq: Three times a day (TID) | ORAL | 11 refills | Status: AC | PRN

## 2024-08-23 MED ORDER — HYDROXYZINE PAMOATE 25 MG PO CAPS: 25.0000 mg | ORAL_CAPSULE | Freq: Four times a day (QID) | ORAL | 11 refills | Status: AC | PRN

## 2024-08-23 MED ORDER — UBRELVY 100 MG PO TABS: ORAL_TABLET | ORAL | 11 refills | Status: AC

## 2024-08-23 MED ORDER — EMGALITY 120 MG/ML ~~LOC~~ SOAJ: 1.0000 mL | SUBCUTANEOUS | 11 refills | Status: AC

## 2024-08-23 NOTE — Progress Notes (Signed)
 "   PATIENT: Sue Blair DOB: 01-04-1966  REASON FOR VISIT: follow up HISTORY FROM: patient   Virtual Visit via Video Note  I connected with Sue Blair on 08/23/24 at  1:45 PM EST by a video enabled telemedicine application located remotely at Home office and verified that I am speaking with the correct person using two identifiers who was located at their own home in [place]   I discussed the limitations of evaluation and management by telemedicine and the availability of in person appointments. The patient expressed understanding and agreed to proceed.     HISTORY OF PRESENT ILLNESS: Today 08/23/24:  Sue Blair is a 59 y.o. female with a history of migraine headaches. Returns today for follow-up.  Overall she has remained stable.  Emgality  continues to work well for her.  She uses Ubrelvy  for abortive therapy.  Having approximately 3-4 migraines a month.  She states that she has hydroxyzine  and baclofen  that she will use as needed for migraine.  She states that she does not have to use these as commonly but does prefer to have them on hand in case she gets a severe migraine  01/19/24: Sue Blair is a 59 y.o. female with a history of migraines headaches. Returns today for follow-up.  Overall she feels that she is doing well.  Emgality  seems to be working to keep her from having severe migraines.  She states that she has approximately 5 headaches a month.  These headaches will typically resolve with Ubrelvy .  On occasion she may have to use the baclofen .  She does have hydroxyzine  but reports that she has not had to use that.  Overall she has been doing well.  She returns today for an evaluation.  10/13/23: Sue Blair is a 59 y.o. female who has been followed in this office for Migraine. Returns today for follow-up.  She states that she Went two months without emgality  due to insurance- she had no headaches. So she stayed off of it for 10 months with no headaches. On 08/18/23 she had the  worst headache. Had an aura. She has nausea and vomiting. Lasted 9 hours. Was unable to keep meds down. She did try Zomig nasal spray but it didn't help. Zofran  helped nausea and then she was able to try vistaril , baclofen  and ubrelvy . The headache didn't completely go away until 2 days later.   She then decided to take emgality . She got a headache 2 weeks later on 09/07/23. No vomiting or aura. She took ubrlevy, baclofen  and hydroxyzine .  Slept 2-3 hours. Felt better. Then 3 weeks later 10/01/23 woke with migraine. Took the same thing. It helped. The next day had another migraine. Repeated medication. Now having about 10 mild headaches a month.  She states that this is better than having severe headaches.  Reports that she has a lot of anxiety around having another severe headache.  So she would rather stay on Emgality  since that has been the only thing that has helped the severity of her migraines in the past.   HISTORY  Sue Blair is a 59 y.o. female here as requested by Bluford Jhonny ORN, FNP for migraines.  I reviewed [name]'s notes, patient has significantly improved on Emgality , greater than 50% improvement in migraine frequency and headache frequency and severity, she currently has 6 headaches days per month that can last up to 1 to 2 days but the intensity again has diminished significantly.  She continues treatment  for TMJ with PT and is followed by dentistry.  She also continues a lot of Knechtel for her mood which has been stable.  She is stable on current medications, Zomig nasal spray helps within 30 minutes, Ubrelvy  is also effective and she is pleased, Zofran  for nausea unless she cannot tolerate oral then she takes Phenergan.   Patient is here for transfer of care, she has migraines which can be pulsating pounding throbbing, they can be unilateral, photophobia phonophobia, nausea, vomiting, hurts to move, a dark room helps, she has been doing very well on Emgality  and Zomig. Saw Dr.  Chalice and getting a sleep evaluation. Has been having trouble getting emgality . She was able to get a syringe version. Prior to emgality  she was having > 15 migraine days a month and most of the month headaches since then 5 migraine days a month. Cheese can triger a migraine, sleep helps, strong smells can trigger, weather can affect migraines as well. No aura. No medication overuse. The last time she took emgality  has been months maybe 3 months ago. Has been struggling with migraines since the age of 33. No other focal neurologic deficits, associated symptoms, inciting events or modifiable factors.   Reviewed notes, labs and imaging from outside physicians, which showed: I thoroughly reviewed [name]'s notes and patient's notes in epic and care everywhere, the following are list of medications tried by patient for headaches/migraines:   Current and past medications: ANALGESICS:bc, darvocet, fioricet ANTI-MIGRAINE:imitrex spray, imitrex injection (SE), maxalt, zomig nasal spray HEART/BP:  blood pressure medications contraindicated due to  DECONGESTANT/ANTIHISTAMINE:allegra, benadryl , dramamine, flonase, sudafed, zyrtec ANTI-NAUSEANT:compazine , reglan , phenergan, zofran  NSAIDS:ibuprofen, naproxen MUSCLE RELAXANTS: baclofen  ANTI-CONVULSANTS:lamictal, topamax STEROIDS:hydrocortisone  SLEEPING PILLS/TRANQUILIZERS:xanax ANTI-DEPRESSANTS:celexa, effexor, paxil, amitriptyline HERBAL: migrelief FIBROMYALGIA:  HORMONAL:mircette OTHER: Emgality , aimovig contraindicated due to constipation, ubrelvy  PROCEDURES FOR HEADACHES:    Mri brain: CLINICAL DATA:  Focal neuro deficit, greater than 6 hours, stroke suspected. Additional history: Headache, numbness bilateral lower extremities yesterday.   EXAM: MRI HEAD WITHOUT CONTRAST   TECHNIQUE: Multiplanar, multiecho pulse sequences of the brain and surrounding structures were obtained without intravenous contrast.   COMPARISON:  Head CT  03/04/2019, brain MRI 08/29/2005.   FINDINGS: Brain: No restricted diffusion is demonstrated to suggest acute or recent subacute infarction. No evidence of intracranial mass or intracranial hemorrhage. No midline shift or extra-axial collection. Mild scattered T2/FLAIR hyperintensity within the cerebral white matter and brainstem is consistent with chronic small vessel ischemic disease and slightly progressed as compared to prior MRI 08/29/2005. Redemonstrated small chronic lacunar infarct within the left cerebellum. Symmetric T2 hyperintensity within the globus pallidus bilaterally is similar to prior MRI, and may reflect sequela of remote insult. Cerebral volume is age appropriate.   Vascular: Flow voids maintained within the proximal large vessels.   Skull and upper cervical spine: Normal marrow signal. Reversal of the expected cervical lordosis.   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 Small air-fluid levels within the bilateral sphenoid sinuses. No significant mastoid effusion.   IMPRESSION: No evidence of acute intracranial abnormality.   Mild chronic small vessel ischemic disease slightly progressed from MRI 08/29/2005. 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   Unchanged symmetric signal changes within the globus pallidus bilaterally, possibly reflecting sequela of remote insult.   Small bilateral sphenoid sinus air-fluid levels. Correlate for acute sinusitis.  REVIEW OF SYSTEMS: Out of a complete 14 system review of symptoms, the patient complains only of the following symptoms, and all other reviewed systems are negative.  ALLERGIES: Allergies  Allergen Reactions   Metoclopramide  Anxiety    Other Reaction(s): Agitation   Clarithromycin Hives   Sulfonamide Derivatives Hives    HOME MEDICATIONS: Outpatient Medications Prior to Visit  Medication Sig Dispense Refill   baclofen  (LIORESAL ) 10 MG  tablet Take 1 tablet (10 mg total) by mouth 3 (three) times daily as needed (For migraine). 30 each 11   buPROPion (WELLBUTRIN XL) 150 MG 24 hr tablet Take 150 mg by mouth every morning.     citalopram (CELEXA) 40 MG tablet Take 40 mg by mouth daily.      cyanocobalamin  (,VITAMIN B-12,) 1000 MCG/ML injection Inject 1 mL (1,000 mcg total) into the muscle every 30 (thirty) days. 12 mL 0   EMGALITY  120 MG/ML SOAJ Inject 1 mL into the skin every 30 (thirty) days. 1 mL 11   estradiol (ESTRACE) 0.1 MG/GM vaginal cream INSERT 0.5 G TWICE A WEEK BY VAGINAL ROUTE AT BEDTIME     estradiol (VIVELLE-DOT) 0.1 MG/24HR patch Place 1 patch onto the skin 2 (two) times a week.     fluorometholone (FML) 0.1 % ophthalmic suspension Place 1 drop into the right eye 2 (two) times daily.     gabapentin (NEURONTIN) 300 MG capsule Take 300 mg by mouth 2 (two) times daily.     hydrOXYzine  (VISTARIL ) 25 MG capsule Take 1 capsule (25 mg total) by mouth every 6 (six) hours as needed. 30 capsule 11   lamoTRIgine (LAMICTAL) 100 MG tablet Take 200 mg by mouth daily.      ondansetron  (ZOFRAN -ODT) 4 MG disintegrating tablet Take 1 tablet (4 mg total) by mouth every 8 (eight) hours as needed for nausea or vomiting. 20 tablet 0   pantoprazole  (PROTONIX ) 40 MG tablet TAKE 1 TABLET BY MOUTH TWICE A DAY 180 tablet 2   Ubrogepant  (UBRELVY ) 100 MG TABS Take 1 tablet at the onset of a migraine.  Can repeat in 2 hours if needed.  Only 2 tablets in 24 hours 15 tablet 11   valACYclovir  (VALTREX ) 1000 MG tablet Take 1 tablet by mouth daily.     No facility-administered medications prior to visit.    PAST MEDICAL HISTORY: Past Medical History:  Diagnosis Date   Anxiety    Chronic headaches    Colitis    lymphocytic colitis - resolved   Gastric polyp    GERD (gastroesophageal reflux disease) 06/17/2007   Hiatal hernia 06/17/2007   History of colon polyps    IBS (irritable bowel syndrome)    Migraines    Osteopenia    Osteoporosis     Tennis elbow     PAST SURGICAL HISTORY: Past Surgical History:  Procedure Laterality Date   AUGMENTATION MAMMAPLASTY Bilateral    BREAST BIOPSY Right 09/28/2023   MM RT BREAST BX W LOC DEV 1ST LESION IMAGE BX SPEC STEREO GUIDE 09/28/2023 [facility]   BREAST ENHANCEMENT SURGERY     CESAREAN SECTION  1998   COLONOSCOPY     LAPAROSCOPIC ASSISTED VAGINAL HYSTERECTOMY  10-2013   POLYPECTOMY      FAMILY HISTORY: Family History  Problem Relation Age of Onset   Migraines Mother    Breast cancer Mother 59   Diabetes Father    Breast cancer Maternal Grandmother 78   Breast cancer Maternal Aunt 61   Colon cancer Maternal Uncle    Colon cancer Paternal Uncle    Crohn's disease Paternal Uncle    Breast cancer Cousin    Migraines Son  Migraines Daughter    Esophageal cancer Neg Hx    Rectal cancer Neg Hx    Stomach cancer Neg Hx     SOCIAL HISTORY: Social History   Socioeconomic History   Marital status: Married    Spouse name: Not on file   Number of children: 2   Years of education: Not on file   Highest education level: Not on file  Occupational History   Occupation: print production planner    Employer: [employer]  Tobacco Use   Smoking status: Former    Current packs/day: 0.00    Average packs/day: 0.5 packs/day for 5.0 years (2.5 ttl pk-yrs)    Types: Cigarettes    Start date: 05/22/1980    Quit date: 05/22/1985    Years since quitting: 39.2   Smokeless tobacco: Never  Vaping Use   Vaping status: Never Used  Substance and Sexual Activity   Alcohol use: No    Comment: maybe once or twice a year   Drug use: No   Sexual activity: Yes    Partners: Male  Other Topics Concern   Not on file  Social History Narrative   Caffeine : heavy    Social Drivers of Health   Tobacco Use: Medium Risk (09/10/2023)   Patient History    Smoking Tobacco Use: Former    Smokeless Tobacco Use: Never    Passive Exposure: Not on Actuary Strain: Not on  file  Food Insecurity: No Food Insecurity (09/17/2021)   [facility]   Epic    Within the past 12 months, you worried that your food would run out before you got the money to buy more.: Never true    Within the past 12 months, the food you bought just didn't last and you didn't have money to get more.: Never true  Transportation Needs: Not on file  Physical Activity: Not on file  Stress: Not on file  Social Connections: Unknown (12/06/2021)   [facility]   Social Network    Social Network: Not on file  Intimate Partner Violence: Unknown (11/07/2021)   Received from [facility]   HITS    Physically Hurt: Not on file    Insult or Talk Down To: Not on file    Threaten Physical Harm: Not on file    Scream or Curse: Not on file  Depression (EYV7-0): Not on file  Alcohol Screen: Not on file  Housing: Not on file  Utilities: Not on file  Health Literacy: Not on file      PHYSICAL EXAM   Generalized: Well developed, in no acute distress   Neurological examination  Mentation: Alert oriented to time, place, history taking. Follows all commands speech and language fluent Cranial nerve II-XII: Facial symmetry noted  DIAGNOSTIC DATA (LABS, IMAGING, TESTING) - I reviewed patient records, labs, notes, testing and imaging myself where available.  Lab Results  Component Value Date   WBC 4.6 02/24/2022   HGB 11.5 (L) 02/24/2022   HCT 33.8 (L) 02/24/2022   MCV 94.1 02/24/2022   PLT 272.0 02/24/2022      Component Value Date/Time   NA 137 02/24/2022 1542   K 3.3 (L) 02/24/2022 1542   CL 103 02/24/2022 1542   CO2 28 02/24/2022 1542   GLUCOSE 85 02/24/2022 1542   BUN 8 02/24/2022 1542   CREATININE 0.66 02/24/2022 1542   CALCIUM 8.9 02/24/2022 1542   GFRNONAA >60 03/04/2019 1035   GFRAA >60 03/04/2019  1035   No results found for: CHOL, HDL, LDLCALC, LDLDIRECT, TRIG, CHOLHDL No results found for: YHAJ8R Lab Results  Component Value  Date   VITAMINB12 436 12/27/2015   No results found for: TSH    ASSESSMENT AND PLAN 59 y.o. year old female  has a past medical history of Anxiety, Chronic headaches, Colitis, Gastric polyp, GERD (gastroesophageal reflux disease) (06/17/2007), Hiatal hernia (06/17/2007), History of colon polyps, IBS (irritable bowel syndrome), Migraines, Osteopenia, Osteoporosis, and Tennis elbow. here with:  1.  Migraine headaches  - Continue Emgality  monthly injection  -Continue hydroxyzine  25 mg for migraine every 6 hours as needed -Continue baclofen  10 mg up to 3 times a day as needed for migraine --Continue Ubrelvy  for abortive therapy.  Take 1 tablet at the onset of a migraine repeat in 2 hours if needed.  Only 2 tablets in 24 hours -Continue Zofran  for nausea -Encouraged her to use Ubrelvy  when she gets a migraine.  If that is not beneficial then she can use the hydroxyzine  or the baclofen .  Patient voiced understanding. -Follow-up in 6 months or sooner if needed  Meds ordered this encounter  Medications   EMGALITY  120 MG/ML SOAJ    Sig: Inject 1 mL into the skin every 30 (thirty) days.    Dispense:  1 mL    Refill:  11    Supervising Provider:   YAN, YIJUN [[id number]]   baclofen  (LIORESAL ) 10 MG tablet    Sig: Take 1 tablet (10 mg total) by mouth 3 (three) times daily as needed (For migraine).    Dispense:  30 each    Refill:  11    Supervising Provider:   YAN, YIJUN [phone]   Ubrogepant  (UBRELVY ) 100 MG TABS    Sig: Take 1 tablet at the onset of a migraine.  Can repeat in 2 hours if needed.  Only 2 tablets in 24 hours    Dispense:  15 tablet    Refill:  11    Supervising Provider:   YAN, YIJUN [[id number]]   hydrOXYzine  (VISTARIL ) 25 MG capsule    Sig: Take 1 capsule (25 mg total) by mouth every 6 (six) hours as needed.    Dispense:  30 capsule    Refill:  11    Supervising Provider:   YAN, YIJUN [[id number]]        Duwaine Russell, MSN, NP-C 08/23/2024, 1:51 PM [facility] [address] [place], [place] [address] [phone]  The patient's condition requires frequent monitoring and adjustments in the treatment plan, reflecting the ongoing complexity of care.  This provider is the continuing focal point for all needed services for this condition.  "

## 2024-09-19 ENCOUNTER — Encounter

## 2024-09-19 ENCOUNTER — Other Ambulatory Visit

## 2025-05-02 ENCOUNTER — Telehealth: Admitting: Adult Health
